# Patient Record
Sex: Female | Born: 1945 | Race: White | Hispanic: No | Smoking: Former smoker
Health system: Southern US, Community
[De-identification: ages and names within clinical notes are randomized; demographics above are authoritative.]

## PROBLEM LIST (undated history)

## (undated) DIAGNOSIS — I050 Rheumatic mitral stenosis: Secondary | ICD-10-CM

## (undated) DIAGNOSIS — I482 Chronic atrial fibrillation, unspecified: Secondary | ICD-10-CM

## (undated) DIAGNOSIS — E119 Type 2 diabetes mellitus without complications: Secondary | ICD-10-CM

## (undated) DIAGNOSIS — Z951 Presence of aortocoronary bypass graft: Secondary | ICD-10-CM

## (undated) HISTORY — DX: Type 2 diabetes mellitus without complications: E11.9

## (undated) HISTORY — DX: Rheumatic mitral stenosis: I05.0

## (undated) HISTORY — DX: Presence of aortocoronary bypass graft: Z95.1

## (undated) HISTORY — DX: Chronic atrial fibrillation, unspecified: I48.20

---

## 2005-08-22 ENCOUNTER — Inpatient Hospital Stay (HOSPITAL_COMMUNITY): Admission: RE | Admit: 2005-08-22 | Discharge: 2005-08-31 | Payer: Self-pay | Admitting: Cardiothoracic Surgery

## 2005-08-24 ENCOUNTER — Encounter (INDEPENDENT_AMBULATORY_CARE_PROVIDER_SITE_OTHER): Payer: Self-pay | Admitting: *Deleted

## 2005-09-15 ENCOUNTER — Encounter: Admission: RE | Admit: 2005-09-15 | Discharge: 2005-09-15 | Payer: Self-pay | Admitting: Cardiothoracic Surgery

## 2005-10-25 ENCOUNTER — Ambulatory Visit: Payer: Self-pay | Admitting: Cardiology

## 2005-10-27 ENCOUNTER — Ambulatory Visit: Payer: Self-pay | Admitting: Cardiology

## 2005-10-28 ENCOUNTER — Ambulatory Visit: Payer: Self-pay | Admitting: Cardiology

## 2005-11-04 ENCOUNTER — Ambulatory Visit: Payer: Self-pay | Admitting: Cardiology

## 2005-11-11 ENCOUNTER — Ambulatory Visit: Payer: Self-pay | Admitting: Cardiology

## 2005-11-25 ENCOUNTER — Ambulatory Visit: Payer: Self-pay | Admitting: Cardiology

## 2005-12-02 ENCOUNTER — Ambulatory Visit: Payer: Self-pay | Admitting: Cardiology

## 2005-12-16 ENCOUNTER — Ambulatory Visit: Payer: Self-pay | Admitting: Cardiology

## 2006-06-26 ENCOUNTER — Ambulatory Visit: Payer: Self-pay | Admitting: Cardiology

## 2006-07-10 ENCOUNTER — Ambulatory Visit: Payer: Self-pay | Admitting: Cardiology

## 2006-07-19 ENCOUNTER — Ambulatory Visit: Payer: Self-pay | Admitting: Cardiology

## 2006-07-26 ENCOUNTER — Ambulatory Visit: Payer: Self-pay | Admitting: Cardiology

## 2006-08-09 ENCOUNTER — Ambulatory Visit: Payer: Self-pay | Admitting: Cardiology

## 2007-02-21 ENCOUNTER — Ambulatory Visit: Payer: Self-pay | Admitting: Cardiology

## 2007-02-26 ENCOUNTER — Ambulatory Visit: Payer: Self-pay | Admitting: Cardiology

## 2007-03-07 ENCOUNTER — Ambulatory Visit: Payer: Self-pay | Admitting: Cardiology

## 2007-03-08 ENCOUNTER — Ambulatory Visit: Payer: Self-pay | Admitting: Internal Medicine

## 2007-03-14 ENCOUNTER — Ambulatory Visit: Payer: Self-pay | Admitting: Cardiology

## 2007-03-30 ENCOUNTER — Ambulatory Visit: Payer: Self-pay | Admitting: Cardiology

## 2007-04-03 ENCOUNTER — Ambulatory Visit: Payer: Self-pay | Admitting: Internal Medicine

## 2007-04-06 ENCOUNTER — Ambulatory Visit: Payer: Self-pay | Admitting: Cardiology

## 2007-04-13 ENCOUNTER — Ambulatory Visit: Payer: Self-pay | Admitting: Cardiology

## 2007-04-18 ENCOUNTER — Ambulatory Visit: Payer: Self-pay | Admitting: Cardiology

## 2007-05-10 ENCOUNTER — Ambulatory Visit: Payer: Self-pay | Admitting: Cardiology

## 2007-06-05 ENCOUNTER — Ambulatory Visit: Payer: Self-pay | Admitting: Cardiology

## 2007-06-12 ENCOUNTER — Ambulatory Visit: Payer: Self-pay | Admitting: Cardiology

## 2007-11-30 ENCOUNTER — Ambulatory Visit: Payer: Self-pay | Admitting: Cardiology

## 2007-12-06 ENCOUNTER — Ambulatory Visit: Payer: Self-pay | Admitting: Cardiology

## 2007-12-11 ENCOUNTER — Ambulatory Visit: Payer: Self-pay | Admitting: Cardiology

## 2007-12-20 ENCOUNTER — Ambulatory Visit: Payer: Self-pay | Admitting: Cardiology

## 2008-01-07 ENCOUNTER — Ambulatory Visit: Payer: Self-pay | Admitting: Cardiology

## 2008-01-19 ENCOUNTER — Inpatient Hospital Stay (HOSPITAL_COMMUNITY): Admission: EM | Admit: 2008-01-19 | Discharge: 2008-01-24 | Payer: Self-pay | Admitting: Interventional Cardiology

## 2008-01-19 ENCOUNTER — Ambulatory Visit: Payer: Self-pay | Admitting: Cardiology

## 2008-01-21 ENCOUNTER — Encounter: Payer: Self-pay | Admitting: Internal Medicine

## 2008-02-01 ENCOUNTER — Ambulatory Visit: Payer: Self-pay | Admitting: Cardiology

## 2008-02-06 ENCOUNTER — Ambulatory Visit: Payer: Self-pay | Admitting: Cardiology

## 2008-02-08 ENCOUNTER — Ambulatory Visit: Payer: Self-pay | Admitting: Cardiology

## 2008-02-19 ENCOUNTER — Ambulatory Visit: Payer: Self-pay | Admitting: Cardiology

## 2008-02-29 ENCOUNTER — Ambulatory Visit: Payer: Self-pay | Admitting: Cardiology

## 2008-03-18 ENCOUNTER — Ambulatory Visit: Payer: Self-pay | Admitting: Cardiology

## 2008-04-04 ENCOUNTER — Ambulatory Visit: Payer: Self-pay | Admitting: Cardiology

## 2008-04-22 ENCOUNTER — Ambulatory Visit: Payer: Self-pay | Admitting: Cardiology

## 2008-05-13 ENCOUNTER — Ambulatory Visit: Payer: Self-pay | Admitting: Cardiology

## 2008-06-10 ENCOUNTER — Ambulatory Visit: Payer: Self-pay | Admitting: Cardiology

## 2008-06-27 ENCOUNTER — Ambulatory Visit: Payer: Self-pay | Admitting: Cardiology

## 2008-07-23 ENCOUNTER — Ambulatory Visit: Payer: Self-pay | Admitting: Cardiology

## 2008-10-08 ENCOUNTER — Ambulatory Visit: Payer: Self-pay | Admitting: Cardiology

## 2008-10-24 ENCOUNTER — Ambulatory Visit: Payer: Self-pay | Admitting: Cardiology

## 2008-12-08 ENCOUNTER — Encounter: Payer: Self-pay | Admitting: *Deleted

## 2009-01-19 ENCOUNTER — Encounter: Payer: Self-pay | Admitting: Cardiology

## 2009-04-27 IMAGING — CR DG CHEST 2V
2 series · 2 of 2 positions shown · non-contrast
Comparison: 09/15/2005 and earlier.

CLINICAL DATA: 62-year-old female with chest pain.

CHEST - 2 VIEW

[view not recorded (1 of 2)]
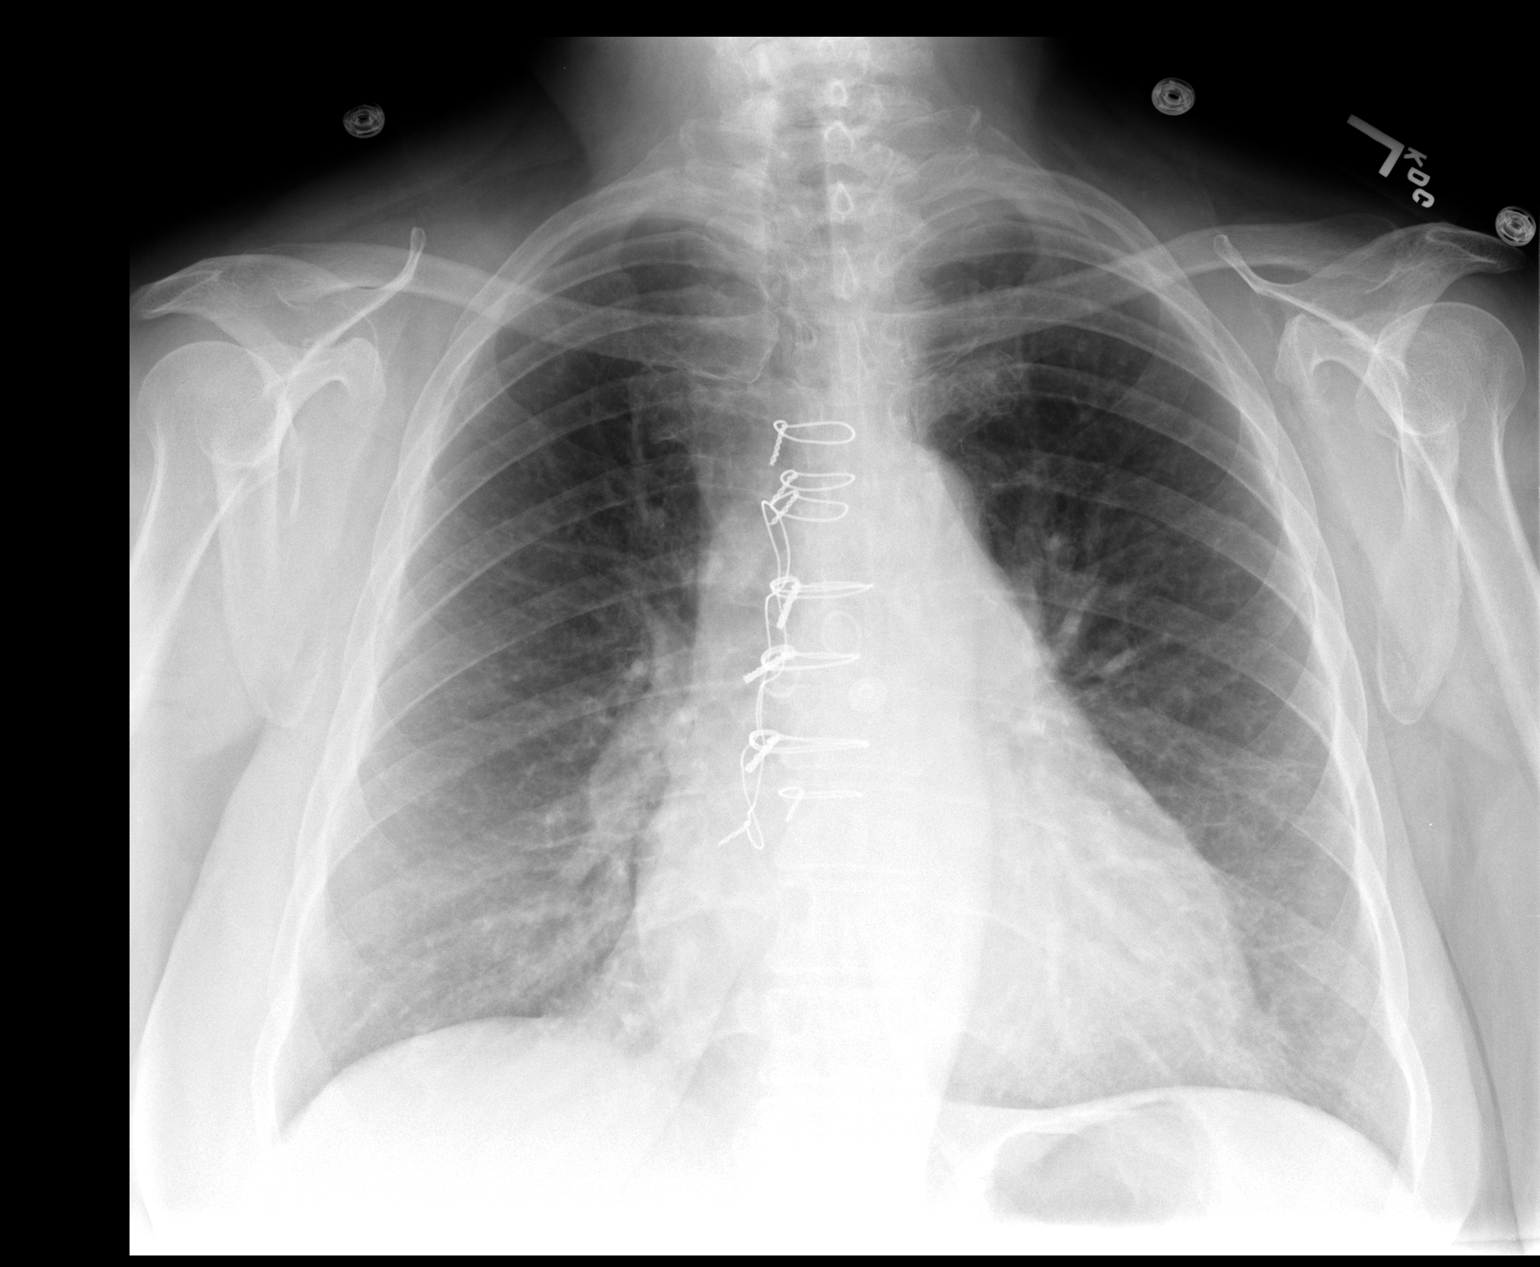

[view not recorded (2 of 2)]
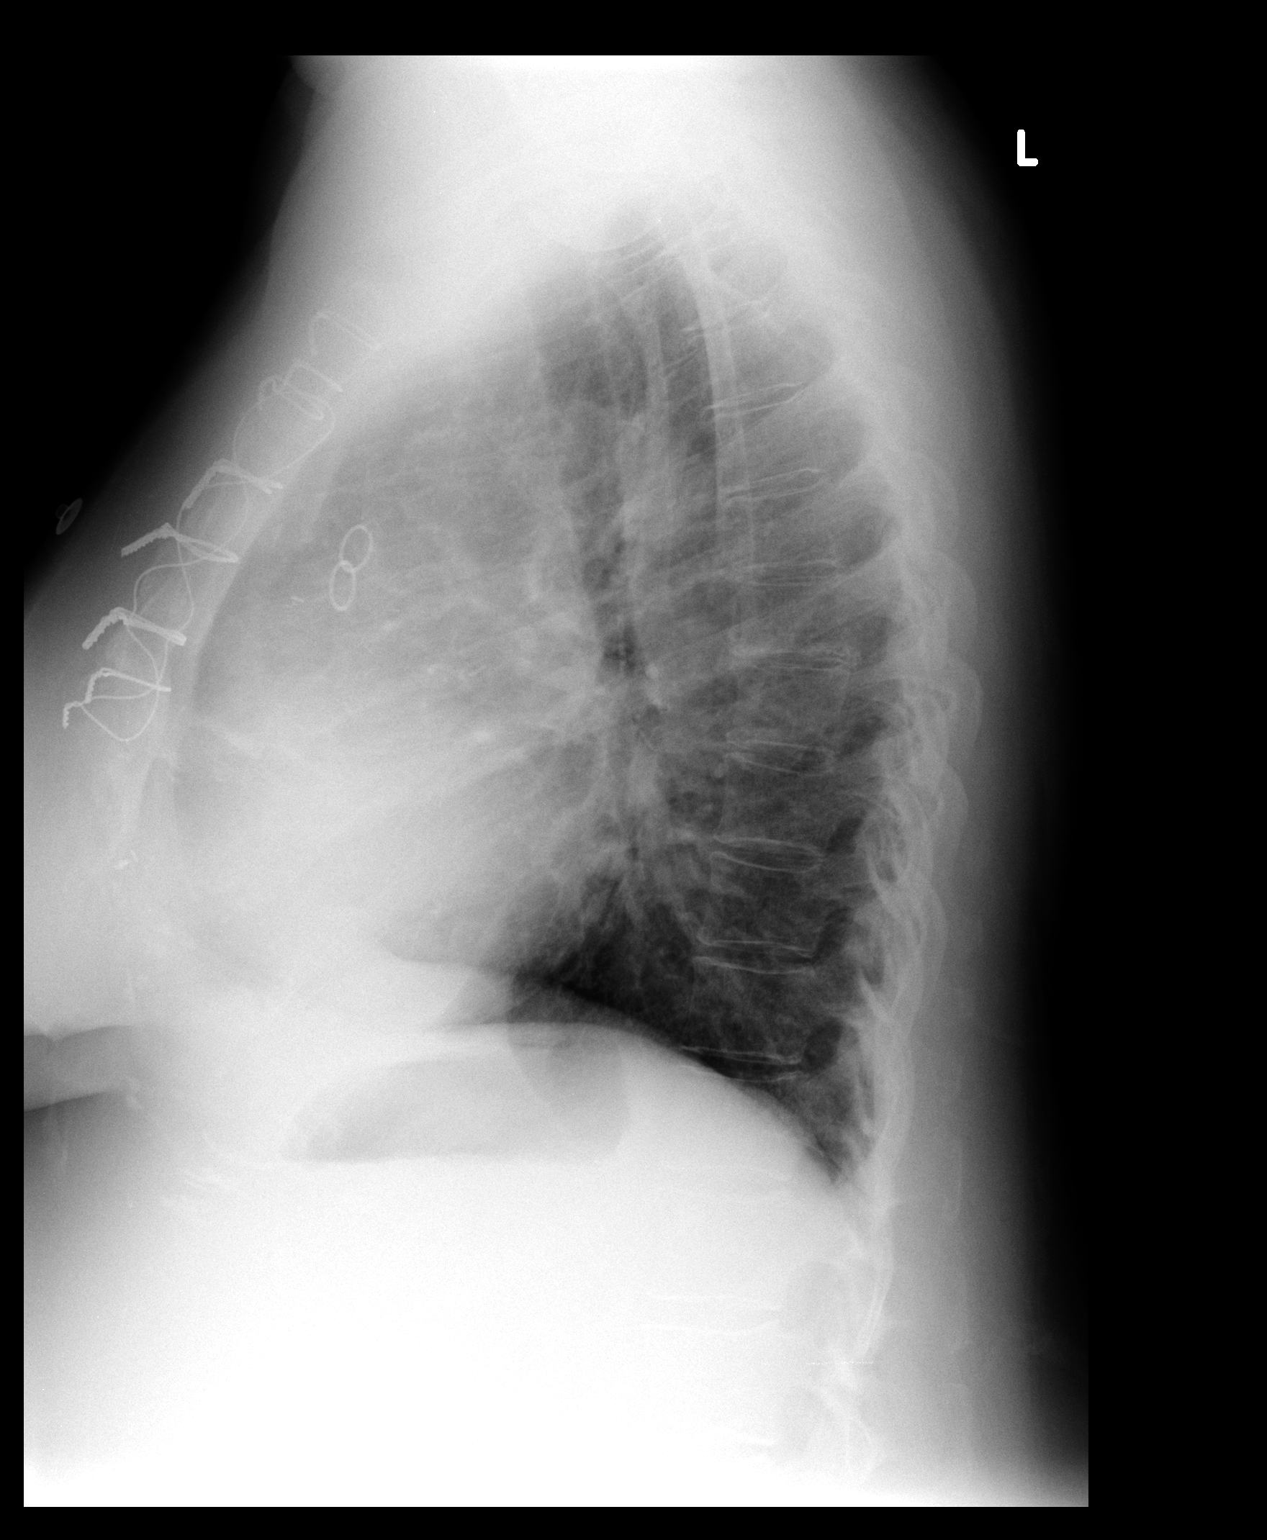

[2 of 2 positions shown; findings below may reference images not displayed]

FINDINGS: Stable cardiac size and mediastinal contours.
Cardiomegaly.  Stable postoperative appearance of the sternum and
mediastinum.  Stable lung volumes.  No pneumothorax, pulmonary
edema, pleural effusion or acute airspace opacity. Stable
visualized osseous structures.
IMPRESSION: No acute cardiopulmonary abnormality.  Stable cardiomegaly.

## 2010-09-07 NOTE — Assessment & Plan Note (Signed)
[facility]                          [facility]   [name], [name] [name]                        MRN:          [id number]  DATE:10/08/2008                            DOB:          Jun 01, 1945    HISTORY OF PRESENT ILLNESS:  The patient is a 65 year old female with a  history of mitral valve replacement with bioprosthesis.  She also had  postoperative atrial fibrillation, status post bilateral Maze procedure  in May 2007, in addition to coronary artery bypass grafting.  The  patient has had also atrial flutter with 3:1 conduction and she was  declined for flutter ablation.  The patient was also never cardioverted.  She did present with an episode of atrial fibrillation and reverted  normal sinus rhythm with Tikosyn.  However, she has stopped Tikosyn  since November 2009.  She states the drug was giving her chest pain.  She states she feels better now off medication.  Today in the office,  she is in sinus bradycardia.   The patient is plan to move to [place].  She will pick up her records  next week.  She is also due for a stress test since her last operative  intervention in 2007.   MEDICATIONS:  1. Pravastatin 40 mg p.o. nightly.  2. Lisinopril 5 mg p.o. daily.  3. Zoloft  50 mg one and half tablet p.o. q.a.m.  4. Slo-Niacin 500 mg p.o. nightly.  5. Warfarin as directed.  6. Levothyroxine 175 mcg p.o. daily.  7. Lanoxin 250 mcg daily, half tablet on Monday, Wednesday, and      Friday.  8. Metoprolol tartrate 50 mg p.o. b.i.d.  9. Aspirin 81 mg p.o. daily.  10.Fenofibrate 160 mg p.o. nightly.  11.Novolin 30 units q.a.m. and 20 units nightly.  12.Levemir Flexpen 6 units q.a.c.  13.Tylenol 1 in the morning and 2 in the evening.  14.Fish oil 1200 mg p.o. daily.   PHYSICAL EXAMINATION:  VITAL SIGNS:  Blood pressure 153/63, heart rate  44, and weight 195 pounds.  GENERAL:  Well-nourished white female in no apparent distress.  HEENT:   Pupils, eyes clear; conjunctivae clear.  NECK:  Supple.  Normal carotid upstroke.  No carotid bruits.  LUNGS:  Clear breath sounds bilaterally.  HEART:  Regular rate and rhythm with normal S1 and S2.  No murmurs,  rubs, or gallops.  ABDOMEN:  Soft and nontender.  No rebound or guarding.  Good bowel  sounds.  EXTREMITIES:  No cyanosis, clubbing, or edema.  NEUROLOGIC:  The patient is alert, oriented, and grossly non focal.   PROBLEM LIST:  1. Sinus bradycardia, stable.  2. Tikosyn discontinued by the patient.  3. History of atrial flutter with 3:1 AV conduction.  4. History of amiodarone use.  5. Coronary artery disease with bioprosthetic mitral valve placement      in 2007 (four-vessel bypass grafting).  6. History of rheumatic heart disease.  7. Treated hypothyroidism.  8. History of pulmonary embolism.  9. Type 2 diabetes mellitus.  10.Hypertension, uncontrolled.   PLAN:  1. The patient's lisinopril will be  increased from 5-10 mg p.o. daily      for better blood pressure control.  2. The patient is due for stress testing as she is 3 years out from      her bypass surgery.  However, she is plan to go to [place] and      is planning to do this later this year, once she has settled in her      new place.  3. The patient will come next week to pick up her records for her      future doctors.  4. I talked with the patient regarding reinitiation of Tikosyn, but      that she otherwise declined this.  I told her that I am in      agreement with this for now as she is in normal sinus rhythm and as      long as she continues to take her warfarin.     Learta Codding, MD,FACC  Electronically Signed    GED/MedQ  DD: 10/08/2008  DT: 10/09/2008  Job #: [id number]   cc:   Sula Rumple, MD

## 2010-09-07 NOTE — Assessment & Plan Note (Signed)
[facility]                          [facility] NOTE   [name], [name]                        MRN:          [id number]  DATE:02/06/2008                            DOB:          Sep 24, 1945    HISTORY OF PRESENT ILLNESS:  The patient is 65 year old female with  history of  mitral valve replacement bioprosthetic valve.  She also has  postoperative atrial fibrillation and is status post bilateral maze  procedure in May 2007.  However, the patient for a long time had  persistent atrial flutter with 3:1 conduction.  We had referred her to  Dr. __________, who did not feel she was a good candidate for flutter  ablation.  There was some talk even given to cardioversion with  __________ under dofetilide.  However, the patient had not been seen in  the office for several months.  She was recently admitted to [facility] because of recurrent palpitation and found to be in atrial  fibrillation.  She was given Tikosyn and converted to normal sinus  rhythm.  She is now in sinus bradycardia.   MEDICATIONS:  1. Pravastatin 40 mg p.o. q.h.s.  2. Lisinopril 5 mg p.o. daily.  3. Levothyroxine 150 mcg p.o. daily.  4. Folic acid.  5. Aspirin 81 mg daily.  6. Tylenol.  7. Digoxin 0.125 mg p.o. daily.  8. Metoprolol 50 mg p.o. b.i.d.  9. Warfarin 2.5 mg daily.  10.Tikosyn 500 mcg 1 tablet p.o. b.i.d.   PHYSICAL EXAMINATION:  VITAL SIGNS:  Blood pressure is 144/72, heart  rate is 47, and weight is 198 pounds.  NECK:  Normal.  No carotid bruits.  LUNGS:  Clear breath sounds bilaterally.  HEART:  Regular rate and rhythm.  Normal S1 and S2.  No murmurs, rubs,  or gallop.  ABDOMEN:  Soft and nontender.  EXTREMITIES:  No cyanosis, clubbing, or edema.   PROBLEM LIST:  1. Status post atrial flutter with 3:1 AV conduction and normal sinus      rhythm.  2. Sinus bradycardia with a normal QTc at 436.  3. History of amiodarone treatment and  cardioversion, now on Tikosyn      and on Coumadin.  4. Coronary artery disease, with bioprosthetic mitral valve      replacement in 2007 as well as four-vessel bypass graft.  5. Rheumatic heart disease.  6. Treated hypothyroidism.  7. History of pulmonary embolism.  8. Type 2 diabetes mellitus.   PLAN:  1. The patient is doing well on Tikosyn although her heart rate is      somewhat slow and we cut back on the metoprolol to b.i.d.  2. The QTc was reviewed.  It is within normal limits.  3. The patient will come back in 6 months for further followup.     Learta Codding, MD,FACC  Electronically Signed    GED/MedQ  DD: 02/06/2008  DT: 02/07/2008  Job #: [id number]

## 2010-09-07 NOTE — Assessment & Plan Note (Signed)
[facility]                         E[facility]ROP[facility]OLOG[facility] OFFICE NOTE   [name], [name]                        MRN:          [id number]  DATE:03/08/2007                            DOB:          [birth year]    [name] is referred today by Dr. Lewayne Bunting for evaluation of atrial  flutter in the setting of prior mitral valve replacement and bypass  surgery and surgical Maze procedure, all carried out in May of 2007. The  patient was noted to have atrial fibrillation after her surgery and was  treated initially with amiodarone and followed by cardioversion. She  maintained sinus rhythm very nicely initially. She had discontinuation  of her amiodarone back in March. She noted palpitations in October of  this year and was found to be in atrial flutter. By her report, she has  had both typical as well as atypical flutter. These are associated with  palpitations but no chest pain or shortness of breath, PND or orthopnea.  She was begun on Lopressor 25 mg twice daily, and she returns today for  followup. The patient states that she has only palpitations and has had  no syncope or lightheadedness. No other symptoms related to her flutter.   PAST MEDICAL HISTORY:  As noted in the HPI. She also has a history of  chronic Coumadin therapy in the past. She has a history of medical  noncompliance. She has a history of rheumatic heart disease. She has a  history of hypertension. She has a remote history of pulmonary embolism.   Her family history was noncontributory.   SOCIAL HISTORY:  The patient denies tobacco or ethanol abuse.   Her review of systems is as noted in the HPI. With significant exertion,  she does have some dyspnea but otherwise has been stable in this way.  She has had no syncope. Otherwise, all systems were reviewed and found  to be negative except for occasional problems with gastroesophageal  reflux disease.   On physical examination, she is  a pleasant, well-appearing, 65 year old  woman who is no acute distress. The blood pressure was 156/95. The pulse  was 117 and irregular. The respirations were 18. The weight was 194  pounds.  HEENT EXAM:  Was normocephalic, atraumatic. Pupils were equal and round.  The oropharynx was moist. The sclerae were anicteric.  The neck revealed no jugular venous distention. No thyromegaly. Trachea  was midline. The carotids were 2+ and symmetric.  LUNGS:  Were clear bilaterally to auscultation. No wheezes, rales or  rhonchi were present. There was no increased work of breathing.  The cardiovascular exam revealed an irregular rhythm with normal S1 and  S2. I do not appreciate any murmurs.  ABDOMINAL EXAM:  Was soft, nontender, nondistended. There was no  organomegaly. The bowel sounds were present. No rebound or guarding.  EXTREMITIES:  Demonstrated no clubbing, cyanosis, or edema. The pulses  were 2+ and symmetric.  NEUROLOGICAL:  Alert and oriented x3 with cranial nerves intact.  Strength was 5/5 and symmetric.   The EKG demonstrates atypical atrial flutter with 2:1 AV conduction.  This is characterized by positive flutter waves in the lead V1 and what  appeared to be positive flutter waves in the inferior leads.   IMPRESSION:  1. Recurrent atrial flutter following Maze surgery and mitral valve      replacement.  2. Hypertension.  3. History of coronary disease status post bypass surgery.   DISCUSSION:  Ms. Ramp' atrial flutter is present but not particularly  symptomatic. Her ventricular rate is increased still. I discussed  treatment options with the patient and her son who is with her today and  works as a Radiation protection practitioner. The first option will be up titration of her beta  blockers for attempted rate control of her flutter. Second option will  be antiarrhythmic drug therapy, restarting her amiodarone. Third option  will be to proceed with catheterization ablation, which would require an   extensive left atrial ablation procedure, cauterizing gaps in her Maze  line placed a year ago. The risks, benefits, goals and expectations of  all of the above options have been discussed with the patient and her  son. For now, we would like to continue medical therapy to try to  control her ventricular rate. I will see her back in several weeks.  Today, I have increased her Lopressor from 25 twice daily to 50 twice  daily.     Doylene Canning. Ladona Ridgel, MD  Electronically Signed    GWT/MedQ  DD: 03/08/2007  DT: 03/09/2007  Job #: [id number]   cc:   Learta Codding, MD,FACC

## 2010-09-07 NOTE — Assessment & Plan Note (Signed)
[facility]                          [facility] NOTE   Alexandria, Kelley [name]                        MRN:          [id number]  DATE:06/05/2007                            DOB:          [birth year]    HISTORY OF PRESENT ILLNESS:  The patient is a 65 year old female with a  history of mitral valve replacement and bioprosthetic valve.  She also  has postoperative atrial fibrillation status post bilateral MAZE  procedure in May 2007.  However, the patient has had persistent atrial  flutter with 3:1 conduction.  The patient has seen Dr. Ladona Ridgel previously  and is felt not to be a good candidate for a flutter ablation.  There  was some talk given as to cardiovert the patient and place her on  Dofetilide; however, she has not been seen in the office for several  weeks and a Coumadin has not been checked.  The patient denies any chest  pain and no shortness of breath, and she actually stated that she is  doing quite well.  She does not feel any palpitations.   MEDICATIONS:  1. Lipitor 20 mg p.o. daily.  2. Coumadin 2 mg p.o. daily.  3. Folic acid daily.  4. Lisinopril 10 mg, a half a tablet daily.  5. Tylenol.  6. Metoprolol 50 mg p.o. q.12.  7. Digoxin 0.125 mg p.o. daily.  8. Metformin 500 mg p.o. b.i.d.  9. Levoxyl 100 mcg p.o. daily.   PHYSICAL EXAMINATION:  VITAL SIGNS:  Blood pressure is 126/79, heart  rate is 110, weighs 192 pounds.  NECK EXAM:  Normal carotid upstroke and no carotid bruits.  LUNGS:  Clear breath sounds bilaterally.  HEART:  A regular rate and rhythm but tachycardic, no murmur, rubs, or  gallops.  ABDOMEN:  Soft.  EXTREMITY EXAM:  No cyanosis, clubbing, or edema.  NEURO:  The patient is alert, oriented, and grossly nonfocal.   PROBLEM LIST:  1. Atrial flutter with 3:1 AV conduction.  2. History of a MAZE procedure.  3. History of Amiodarone treatment and cardioversion with restoration      to normal sinus rhythm.  4.  A history of chronic Coumadin.  5. Coronary artery disease with concomitant bioprosthetic mitral valve      replacement in 2007, and a four-vessel coronary bypass graft.  6. Rheumatic heart disease.  7. Treated hypothyroidism.  8. Pulmonary embolism.  9. Type 2 diabetes mellitus.   PLAN:  1. I have asked the patient to identify herself with a primary care      physician particularly in light of her diabetes and multiple      medical problems.  2. The patient stated that she had some swelling at the sternotomy      site, and we have ordered a chest x-ray, PA and lateral, to make      sure she does not have any type of wire fracture.  The sternum on      physical examination, however, was stable with no click.  3. The patient's heart rate is poorly controlled and we will increase  Metoprolol to 50 mg p.o. t.i.d.; however, I anticipate that the      patient after proper anticoagulation will need to be considered for      cardioversion and Dofetilide therapy.  4. The patient will come back for PT-INR monitoring and consider      initial therapy in the next couple of weeks.     Learta Codding, MD,FACC  Electronically Signed    GED/MedQ  DD: 06/05/2007  DT: 06/06/2007  Job #: [id number]

## 2010-09-07 NOTE — H&P (Signed)
NAME:  [name], [name]                     ACCOUNT NO.:  [id number]   MEDICAL RECORD NO.:  [id number]          PATIENT TYPE:  INP   LOCATION:  3707                         FACILITY:  [facility]   PHYSICIAN:  Rollene Rotunda, MD, FACCDATE OF BIRTH:  1946-03-25   DATE OF ADMISSION:  01/19/2008  DATE OF DISCHARGE:                              HISTORY & PHYSICAL   PRIMARY CARE PHYSICIAN:  None.   CARDIOLOGIST:  Learta Codding, MD,FACC   REASON FOR PRESENTATION:  Evaluate patient with atrial flutter and chest  pain.   HISTORY OF PRESENT ILLNESS:  The patient is a 65 year old white female a  long history of coronary disease and atrial arrhythmias.  She has had  atrial fibrillation and maze procedure at the time of bypass and mitral  valve replacement.  She has seen Dr. Andee Lineman for this problem and also  Dr. Ladona Ridgel.  She has been in flutter for some time.  The rates have been  slightly difficult to control.  Dr. Ladona Ridgel thought it would be difficult  to ablate because of her previous maze procedure.  The plan was to get  her anticoagulated and plan another cardioversion.  She had one before.  She had been on amiodarone before.  This time.  The thought was to use  Tikosyn.  However, she has not had therapeutic INRs yet.   She presented to [facility] emergency room last evening with her heart  fluttering slightly more than usual.  She also, for the first time, had  chest discomfort.  It was 6/10.  It was sharp and dull.  It was a spot  over her left breast.  It was waxing and waning in intensity.  There was  no radiation to her arms or to her neck.  She had not had this before.  She presented to the [facility] ER where she was found to have flutter  with predominantly 2:1, but also 3:1 conduction.  There were no  diagnostic EKG changes.  First cardiac enzymes were negative.  She was  treated with Cardizem nitroglycerin paste.  She was treated with  Lovenox.  She is pain free.  Atrial flutter as rate is  3:1.  Of note, in  the emergency room her blood sugar was 500.  She was treated with  insulin.  She also had a sodium of 125 and mention of a lipemic blood.   The patient does get around, does some housekeeping.  She says that  recently she has had no chest pressure, neck or arm discomfort.  She has  had no presyncope or syncope.  She has no new dyspnea.  Denies any PND  or orthopnea.  She does feel her heart fluttering occasionally.   PAST HISTORY:  1. 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  She is      status post mitral valve replacement with a [employer] tissue valve      model number [id number].  2. Coronary artery disease (status post CABG with a LIMA to the LAD,      SVG to diagonal, SVG  sequential to an acute marginal and PDA.  All      surgeries in 2007 by Dr. Tyrone Sage).  3. Atrial fibrillation status post maze procedure at the time of      surgeries.  4. Subsequent atrial flutter.  5. Poorly controlled diabetes.  6. History of pulmonary embolism.  7. Hypothyroidism.   PAST SURGICAL HISTORY:  1. Mitral valve replacement.  2. CABG.  3. Maze as described.  4. Tubal ligation.   ALLERGIES:  PENICILLIN.   MEDICATIONS:  1. Levoxyl 175 mcg.  2. Lisinopril 5 mg.  3. Lipitor 20 mg.  4. Coumadin.  5. Folic acid.  6. Metoprolol 50 mg t.i.d.  7. Digoxin 0.125 mg daily.   SOCIAL HISTORY:  The patient is a widow.  She quit smoking in 2007 after  45 pack-year history.   FAMILY HISTORY:  Noncontributory for early coronary artery disease.   REVIEW OF SYSTEMS:  Positive for mild diarrhea, but otherwise negative  for all other systems.   PHYSICAL EXAMINATION:  GENERAL:  The patient is in no distress.  VITAL SIGNS:  Blood pressure 103/64, heart rate 113 and irregular,  respiratory rate 20, afebrile.  HEENT:  Eyes are unremarkable.  Pupils equal, round and reactive to  light.  Fundi not visualized, oral mucosa normal.  NECK:  No jugular distention at 45  degrees.  Carotid upstroke brisk and  symmetrical.  No bruits, thyromegaly.  LYMPHATICS:  No cervical, axillary, inguinal adenopathy.  LUNGS:  Clear to auscultation bilaterally.  BACK:  No costovertebral mass.  CHEST:  Unremarkable.  HEART:  PMI not displaced or sustained, S1 and S2 within normal limits.  No S3, S4, no clicks, rubs, murmurs, distant heart sounds.  ABDOMEN:  Morbidly obese, positive bowel sounds, normal in frequency and  pitch.  No bruits, rebound, guarding or midline pulsatile mass.  No  hepatosplenomegaly.  SKIN:  No rashes, no nodules.  EXTREMITIES:  Upper pulse 2+, femorals 2+, dorsalis pedis 1+ on the  left, absent dorsalis pedis on the right, absent posterior tibialis  bilaterally, no cyanosis, clubbing.  NEUROLOGICAL:  Oriented to person, place and time.  Cranial nerves II-  XII grossly intact, motor grossly intact.   STUDIES:  EKG atrial flutter, axis within normal limits.   LABORATORY DATA:  Labs sodium 125, potassium 3.8, BUN 14, creatinine  0.29, digoxin less than 0.2, cardiac markers times one negative, INR  1.6, pH 7.412, pCO2 37.6, WBC 8.7, hemoglobin 14.8, platelets 220.   Chest x-ray (this was done at [facility], I do not have a report).   ASSESSMENT/PLAN:  1. The patient will continue Lovenox which was started an      [place].  Will use rate control, but I am going to switch her      to metoprolol.  I am going to restart digoxin.  Her low digoxin      level makes me wonder whether she is compliant with any of her      medications.  Will wean the diltiazem.  She will be started on      Coumadin per pharmacy.  I would suggest a TEE, and if no clot, the      initiation of Tikosyn and plan cardioversion which was the plan per      Dr. Andee Lineman and Dr. Ladona Ridgel.  Cost for her medications will be an      issue.  2. Chest pain.  The patient will be ruled out myocardial infarction.  If her enzymes are negative, we could consider a Cardiolite  which      could also be done as an outpatient.  I am not anticipating      invasive workup, so we will start the Coumadin.  3. Diabetes.  She is not at all controlled.  I will put her on her      metformin.  She needs sliding scale insulin.  She may need insulin      and a primary care consult before discharge.  She needs a primary      MD.  4. Hyponatremia.  This may be related to the lipemic blood that was      seen.  We will repeat this.  We will check a blood draw for a lipid      profile.  5. Hypothyroidism.  Check a TSH.  I will continue her current      Synthroid.      Rollene Rotunda, MD, [facility]  Electronically Signed     JH/MEDQ  D:  01/19/2008  T:  01/19/2008  Job:  [id number]

## 2010-09-07 NOTE — Discharge Summary (Signed)
NAMEMarland Kitchen  [name], [name] NO.:  [id number]   MEDICAL RECORD NO.:  [id number]          PATIENT TYPE:  INP   LOCATION:  3707                         FACILITY:  [facility]   PHYSICIAN:  Doylene Canning. Ladona Ridgel, MD    DATE OF BIRTH:  1946/04/08   DATE OF ADMISSION:  01/19/2008  DATE OF DISCHARGE:  01/24/2008                               DISCHARGE SUMMARY   She has allergy to PENICILLIN.   This patient does not have a primary physician.   FINAL DIAGNOSES:  1. Admitted with palpitations and chest pressure.      a.     Atrial flutter with rapid ventricular rate.      b.     Tikosyn started this admission was spontaneous conversion       after dose #2.      c.     The patient had no significant posttermination pause with       dizziness.      d.     Tikosyn therapy at discharge is 250 mcg b.i.d.  2. Admitted hemoglobin A1c is 10.8/no diabetic control previous to      this admission.      a.     Started insulin therapy this admission with Lantus, but will       go home with Humulin N.      b.     Despite 50 units of long-term coverage, the patient still       requires preprandial coverage.  3. TSH 38.646, the patient has hypothyroidism, not taking her      medications at home, discharging on levothyroxine 150 mcg daily.  4. The patient is on the indigent program with Tikosyn and has filled      out all requisite paperwork.   SECONDARY DIAGNOSES:  1. History of rheumatic fever.      a.     History of coronary artery bypass graft surgery, mitral       valve replacement, MAZE procedure in 2007.  2. Known hypothyroidism.  3. Diabetes, poor control.  4. Hypertension.  5. History of atrial flutter and atrial fibrillation, previously on      Coumadin, but subtherapeutic this admission.  6. History of pulmonary embolism.  7. Status post tubal ligation.   The patient had a consultation with diabetes management with effective  institution of insulin therapy.  The patient had effective  treatment  with Tikosyn and did not require cardioversion, but spontaneously  converted after dose #2 and is maintaining sinus rhythm at discharge.  The patient will be restarted on her levothyroxine for control of  hypothyroidism.   BRIEF HISTORY:  [name] is a 65 year old female.  She has a history  of atrial arrhythmias both atrial fibrillation and atrial flutter.  She  had a MAZE procedure in 2007, at the time of coronary artery bypass  graft surgery and mitral valve replacement.   The patient has had rate control, which is difficult.  It is thought  that ablation of atrial flutter will be difficult.  Because of her  previous MAZE  procedure, she had had amiodarone in the past feeling that  a second line would be using Tikosyn.   The patient presented to [facility] Emergency Room on January 18, 2008, she had chest discomfort and palpitation.  There was no radiation  to arm or neck.  She was found to have atrial flutter 2:1 conduction  with possibly 3:1 conduction.  Cardiac enzymes were negative.  She  received Cardizem, nitroglycerin, and Lovenox.  Her blood sugar at the  time of admission to the emergency room was 500.  She has been started  on insulin.  The patient has no presyncope or syncope in her past.  The  patient will be started on rate control with metoprolol and digoxin.  She will be started on Tikosyn.  She will have her cardiac enzymes fully  cycled.  She will have a TSH.  Diabetes therapy, we will start with  insulin.   HOSPITAL COURSE:  The patient presents to [facility] on  January 19, 2008, her INR was subtherapeutic at 1.3.  Her TSH is  grossly elevated at 38.646.  She had elevated serum glucose and she is  in atrial flutter.  Remediations for all of these problems have been  successfully instituted.  At the time of discharge, her INR is 2.4.  She  is on levothyroxine 150 mcg daily.  She is on an insulin regimen with  both Humulin N and  Humulin R.  All her medications are at the [place],  $4 per month except for Tikosyn, which is being administered by [employer]  on [employer] plan, and she is successfully converted to sinus rhythm  at the time of discharge.   Medications at discharge are as follows:  1. Coumadin 5 mg tablets daily except for 1 and 1-1/2 tablets, 7.5 mg      Sunday and Thursday.  2. Tikosyn 250 mcg twice daily 12 hours apart.  3. Digoxin 0.125 mg daily.  4. Metoprolol 50 mg twice daily.  5. Levothyroxine 150 mcg daily.  6. Lisinopril 5 mg daily.  7. Pravastatin 40 mg daily at bedtime.  8. Humulin N 30 units in the morning and 20 units in the evening.  9. Humulin R injected per sliding scale, and the patient has been      given our sliding scale, resistant scale to take home with her.      She has a One Touch at home.  She has been given some insulin      syringes.   She follows at [facility], [place].  She will bring the indigent  forms for [facility] to send to [employer] at the address on the  form.  She has [facility] on Friday, February 01, 2008, at 1:45.  She  will see Dr. Andee Kelley on Wednesday, February 06, 2008, at 1:15.   At the time of discharge, laboratory studies are white cells 7.4,  hemoglobin 12.8, hematocrit 36.8, and platelets 201.  Sodium 135,  potassium 4.9, chloride 101, carbonate 28, BUN is 11, creatinine 0.65,  and glucose 227.  PT is 28.2,  INR 2.4, alkaline phosphatase of 69, SGOT 97, and SGPT is 128.  TSH this  admission was 38.646.  Hemoglobin A1c this admission was 10.8.  Of note,  a dosing schedule of Coumadin will be sent to the [facility]madin Clinic to  help them with her ongoing dosing.      Maple Mirza, PA      Doylene Canning. Ladona Ridgel, MD  Electronically Signed    GM/MEDQ  D:  01/24/2008  T:  01/25/2008  Job:  [id number]   cc:   Alexandria Codding, MD,FACC  Doylene Canning. Ladona Ridgel, MD

## 2010-09-07 NOTE — Assessment & Plan Note (Signed)
[facility]                          [facility]ICE NOTE   [name], [name]                        MRN:          [id number]  DATE:02/26/2007                            DOB:          1945/06/15    HISTORY OF PRESENT ILLNESS:  Patient is a 64 year old with a history of  mitral stenosis status post mitral valve replacement with a tissue  prosthesis.  Patient also has history of 4-vessel coronary artery bypass  grafting.  In addition, the patient underwent left and right-sided maze  procedure, all performed in May 2007.  Postoperatively, the patient had  recurrent atrial fibrillation, and was treated with amiodarone followed  by cardioversion in July 2007.  I last saw the patient in the office on  July 10, 2006, at which time she was in normal sinus rhythm.  Amiodarone was discontinued, and the plan was also to discontinue  Coumadin.  These objectives were achieved, but the patient, on February 21, 2007, started complaining again of palpitations.  I asked her to  come to our office, and an EKG was obtained.  She was found to be in  atrial flutter with a type 1 atrial flutter (typical of flutter).  Indeed, also the patient reported palpitations, although she has  reported no chest pain, shortness of breath, orthopnea, or PND.  At that  point, we restarted patient back on Coumadin, and gave her a new  prescription for Lopressor 25 mg p.o. b.i.d.  The patient's heart is now  controlled with a heart rate of 75 beats per minute.  She states that on  occasions a night she still feel rapid heart rates, but they are very  brief in nature.  I asked the patient to come back to our office now to  discuss more permanent treatment of her atrial flutter by means of a  radio frequency flutter ablation.  In the interim, I had also scheduled  the patient for an appointment with Dr. Ladona Ridgel in the [facility], as no openings were available in the  [place] office were  available.  The patient will bee seen there on March 01, 2007 at 3:15.  The patient stated that this was not inconvenient at all for her to go  to our [place] office.   MEDICATIONS:  1. Lipitor 20 mg p.o. daily.  2. Coumadin, now as directed (recently restarted.  See outline above).  3. Folic acid 1 mg p.o. daily.  4. Levoxyl 175 mcg p.o. daily.  5. Metoprolol 25 mg p.o. b.i.d.  6. Aspirin 81 mg p.o. daily.  7. Lisinopril 10 mg 1/2 tablet p.o. daily.  8. Tylenol p.r.n.   PHYSICAL EXAMINATION:  Blood pressure 133/78.  Heart rate 75 beats per  minute.  Weight is 194 pounds.  NECK EXAM:  Normal carotid upstroke, and no carotid bruits.  LUNGS:  Clear breath sounds bilaterally.  HEART:  Regular rate and rhythm.  Normal S1 and S2.  No murmurs, rubs,  or gallops.  ABDOMEN:  Soft and nontender.  No rebound or guarding.  Good bowel  sounds.  EXTREMITY EXAM:  No cyanosis, clubbing, or edema.   INR today is 1.3.  Appropriate adjustments in Coumadin were made.   PROBLEM LIST:  1. History of atypical atrial flutter, now with typical flutter      (recurrence).  2. History of atrial fibrillation and mitral stenosis.      a.     Status post 4-vessel coronary bypass grafting and mitral       valve replacement with a tissue prosthesis.      b.     Status post left and right-sided maze procedure (A and B       were performed in May 2007).      c.     Status post recurrent atrial fibrillation postoperatively       treated amiodarone and cardioversion with maintenance of normal       sinus rhythm until February 21, 2007.  3. Previous Coumadin anticoagulation, and now Coumadin anticoagulation      restarted.  4. Prior history of medical noncompliance (compliance issues have been      resolved).  5. Mild left ventricular dysfunction with ejection fraction 45% (no      recent echo).  6. Rheumatic heart disease.  Discontinued.  7. Hypertension.  8. Pulmonary  embolism 2 years ago.   PLAN:  1. The patient has now recurrence of typical atrial flutter.  She is      symptomatic with this.  She is status post left and right-sided      maze procedure.  She has a prior history of postoperative atrial      fibrillation, but we have not seen recurrence of atrial      fibrillation.  I will refer the patient to Dr. Ladona Ridgel for      consideration of radiofrequency ablation.  2. In the interim, the patient has been started back on Coumadin, as      well as beta blocker therapy.  She appears to be doing well on this      current regimen.  We have adjusted her Coumadin in the office      today.  3. Patient will also be scheduled for repeat echocardiographic test      study.  4. In addition, the patient is on Levoxyl, and I do not have any      recent thyroid function studies.  We will get a TSH.  We will also      check a CBC, BMET, and hemoglobin A1c.  5. Patient will follow up with [place] in 1 month, and in the interim will      see Dr. Ladona Ridgel for consideration of radiofrequency ablation.     Learta Codding, MD,FACC  Electronically Signed    GED/MedQ  DD: 02/26/2007  DT: 02/27/2007  Job #: [id number]

## 2010-09-07 NOTE — Assessment & Plan Note (Signed)
[facility]                          [facility] NOTE   [name], [name]                        MRN:          [id number]  DATE:03/30/2007                            DOB:          [birth year]    PRIMARY CARDIOLOGIST:  Dr. Lewayne Bunting.   PRIMARY ELECTROPHYSIOLOGIST:  Dr. Lewayne Bunting.   REASON FOR VISIT:  Scheduled follow up. Please refer to Dr. Margarita Mail  recent note of November 3rd, for full details.   Since her last clinic visit, the patient underwent evaluation by Dr.  Lewayne Bunting with respect to her recurrent atrial flutter. He indicated  that she was not particularly symptomatic with this, even with a rate of  117. He recommended an initial option of increasing her Lopressor to 50  mg b.i.d. His second option was to resume Amiodarone, which was  discontinued this past March. His third option was catheter ablation,  but he noted that this would be an extensive procedure involving the  left atrium and cauterizing gaps in her MAZE line, which was placed one  year earlier. He, therefore, recommended continued medical therapy and  she is scheduled to return to him in [name], for continued follow  up, on December 9th.   Clinically, the patient tells me today that she is feeling much better,  following the increased dose of Lopressor. This is, however, despite  that the fact that her rate continues to be elevated (112 currently).  She seems to feel that she is having much less frequent episodes of the  heart pounding sensation.   The patient otherwise denies any interim development of angina pectoris  or dyspnea.   We also reviewed the extensive blood work which Dr. Andee Lineman recently  ordered; these findings were notable for a glucose of 349 with a  hemoglobin A1C of 14.8. The patient has not been previously formerly  diagnosed with diabetes mellitus. She also had a TSH of 0.07, and this  is on Levoxyl. She otherwise has normal  electrolytes, renal function,  and a CBC.   Electrocardiogram today reveals atrial flutter at 112 bpm with normal  axis and non-specific ST changes.   MEDICATIONS:  1. Coumadin 2 mg as directed.  2. Lipitor 20 mg daily.  3. Levoxyl 175 mcg daily.  4. Aspirin 81 mg daily.  5. Lisinopril 5 mg daily.  6. Metoprolol 50 mg every 12 hours.   INR today is 1.8.   PHYSICAL EXAMINATION:  VITAL SIGNS:  Blood pressure 132/91, pulse 112  and regular, weight 187.8 pounds (down 7 pounds).  GENERAL:  This is a 65 year old female, mildly obese, sitting upright in  no distress.  HEENT:  Normocephalic atraumatic.  NECK:  Palpable bilateral carotid bruits without bruits; no JVD.  LUNGS:  Clear to auscultation in all fields.  HEART:  Regular rhythm with increased rate. No significant murmurs.  ABDOMEN:  Benign.  EXTREMITIES:  No significant edema.  NEUROLOGIC:  No focal deficit.   IMPRESSION:  1. Persistent atrial flutter.      a.     2:1 AV conduction.  b.     History of postoperative atrial fibrillation, status post       bilateral MAZE procedure, May 2007.      c.     Status post previous treatment with Amiodarone and previous       cardioversion with restoration of NSR.  2. Chronic Coumadin.  3. Coronary artery disease.      a.     Four vessel CABG with concomitant bioprosthetic MVR, May       2007.  4. Rheumatic heart disease.  5. Treated hypothyroidism.      a.     Recent TSH level of 0.07.  6. History of pulmonary embolism.  7. Type-2 diabetes mellitus.      a.     Newly diagnosed.   PLAN:  1. Continue beta blocker at current dosage, given the patient's      reported symptomatic improvement at this dose. The patient was also      due to return to Dr. Lewayne Bunting in [place], on December 9th,      for further recommendations as well.  2. Down titrate Levoxyl to 0.125 mg daily, in light of recent markedly      low TSH level. She will need a follow up TSH in 4-6 weeks.  3.  Initiate diabetic mellitus treatment with Glucophage 500 mg b.i.d.      The patient will also be referred to the diabetes center here at      [facility], for further assistance in management.  4. The patient is strongly advised to establish with a primary care      physician. Her preference is to have a primary MD here in [place].  5. Closely monitor Protimes on a weekly basis. Plan is to consider a      re-attempt at DC cardioversion in the near future. This will      require dofetilide dosing with hospitalization at [facility], for close monitoring. We will discuss this further at      time of her next      scheduled clinic follow up with [place], in approximately four weeks.      The patient is in agreement with this plan.      Gene Serpe, PA-C  Electronically Signed      Alexandria Codding, MD,FACC  Electronically Signed   GS/MedQ  DD: 03/30/2007  DT: 03/31/2007  Job #: [id number]

## 2010-09-07 NOTE — Assessment & Plan Note (Signed)
[facility]                         ELECTROPHYSIOLOGY OFFICE NOTE   [name], [name]                        MRN:          [id number]  DATE:04/03/2007                            DOB:          August 18, 1945    SUBJECTIVE:  Ms. Ey returns today for followup.  She is a very  pleasant middle-aged woman with a history of atrial flutter and  fibrillation and coronary artery disease, status post bypass surgery, a  Mays procedure over one year ago.  She had initial atrial fibrillation  and then developed atrial flutter several months ago, with a rapid  ventricular response.  I saw her back in the office back in November.  At that time she was relatively asymptomatic.  I should also note that  she has undergone mitral valve replacement.  At that time we talked  about different treatment options.  The patient had been on amiodarone,  but this was discontinued back in March 2008.  She has now been on beta  blockers, including metoprolol 50 mg twice daily with rapid asymptomatic  ventricular response.  When I saw her back in November, we increased her  Lopressor from 25 mg twice daily to 50 mg twice daily.  She continues to  feel well but otherwise has palpitations.   PHYSICAL EXAMINATION:  GENERAL:  She is a pleasant, well-appearing  middle-aged woman, in no acute distress.  VITAL SIGNS:  Blood pressure today 120/76, pulse 112 and regular,  respirations 18, weight 187 pounds.  NECK:  No jugular venous distention.  LUNGS:  Clear bilaterally to auscultation.  No wheezes, rales or rhonchi  are present.  CARDIOVASCULAR:  A regular tachycardia with normal S1 and S2.  EXTREMITIES:  No edema.   CURRENT MEDICATIONS:  1. Lisinopril 10 mg, 1/2 tab daily.  2. Coumadin as directed.  3. Lipitor 20 mg daily.  4. Metoprolol 50 mg twice daily.  5. Metformin 500 mg twice daily.  6. Aspirin 81 mg daily.  7. Synthroid 125 mcg daily.   Electrocardiogram demonstrates  atrial flutter (atypical), with a rapid  ventricular response.   IMPRESSION:  1. Persistent atrial flutter with a rapid ventricular response,      despite beta blockers.  2. History of bypass surgery and Mays procedure with recurrent atrial      fibrillation post-procedure.  3. Chronic Coumadin therapy with sub-therapeutic INRs.   DISCUSSION:  Overall Ms. Hallberg is stable.  I think she will probably  require an anti-arrhythmic drug to maintain sinus rhythm.  She is not  particularly symptomatic in her flutter by her report.  I have  recommended that we for now continue her beta blockers and also start  digoxin 0.125 mg daily.   FOLLOWUP:  I will see her back in the office in approximately one month.  At that time, hopefully her INRs will be therapeutic, and we can  schedule her for a DC cardioversion.     Doylene Canning. Ladona Ridgel, MD  Electronically Signed    GWT/MedQ  DD: 04/03/2007  DT: 04/04/2007  Job #: [id number]

## 2010-09-10 NOTE — Discharge Summary (Signed)
NAMEMarland Kitchen  [name], [name] NO.:  [id number]   MEDICAL RECORD NO.:  [id number]          PATIENT TYPE:  INP   LOCATION:  2037                         FACILITY:  [facility]   PHYSICIAN:  Sheliah Plane, MD    DATE OF BIRTH:  [birth year]   DATE OF ADMISSION:  08/22/2005  DATE OF DISCHARGE:  08/31/2005                                 DISCHARGE SUMMARY   ADMITTING DIAGNOSES:  1.  Significant two vessel coronary artery disease.  2.  Critical mitral stenosis with symptoms of progressive heart failure.  3.  Atrial fibrillation.  4.  History of pulmonary embolism.  5.  Pulmonary hypertension.   DISCHARGE/SECONDARY DIAGNOSES:  1.  Significant two vessel coronary artery disease status post coronary      artery bypass graft.  2.  Critical mitral stenosis status post mitral valve repair.  3.  History of pulmonary embolism.  4.  Preoperative atrial fibrillation with recurrent atrial      fibrillation/atrial flutter postoperatively.  5.  History of rheumatic fever.  6.  History of costochondritis.  7.  Hypothyroidism.  8.  Tubal ligation 1976.  9.  Allergy to PENICILLIN.  10. Mild sternal drainage postoperatively (discharged home with one week of      Keflex).  11. History of tobacco use, but vows to quit postoperatively.  12. Retrosternal nodular density per chest x-ray on April 30, but with      follow-up CT scan showing scarring.  13. Diffuse fatty infiltration of the liver with small hiatal hernia per CT      scan on Aug 23, 2005.   PROCEDURES:  Aug 24, 2005:  Coronary artery bypass grafting x4 the left  internal mammary artery to the left anterior descending, reverse saphenous  vein graft to the diagonal coronary artery, sequential reverse saphenous  vein graft to the acute marginal posterior descending coronary arteries,  mitral valve replacement using the [employer] model [id number], serial  [id number], mitral tissue valve prosthesis and left-to-right side Maze  procedure with endoscopic vein harvesting from the right leg.  Surgeon Dr.  Sheliah Kelley.   BRIEF HISTORY:  [name] is a 65 year old Caucasian female with a history of  rheumatic fever as a child; however, until February of this year was unaware  that she had any significant mitral valve disease.  Several years ago she  had echocardiogram, was told everything was okay with her valves.  In late  February she was admitted to [facility] in [place] with dyspnea  of symptoms and right pleuritic pain.  She was told she had a blood clot in  her lung and was also found to have significant mitral stenosis.  She was  started on Coumadin and ultimately was referred for cardiology consultation  and cardiac catheterization.  She has limited exercise tolerance and  increasing orthopnea and paroxysmal nocturnal dyspnea over the past several  months.  On August 03, 2005 she had episode of dizziness and presented to the  emergency room in [place].  Her INR was noted to be 7 and was told to  stop  taking Coumadin.  Echocardiogram was performed on March 28 which showed  normal left ventricular function with mild global left ventricular systolic  dysfunction with ejection fraction estimated at 45%, mild concentric left  ventricular hypertrophy, moderate to severe valvular stenosis with a mean  gradient of 8.6, and valve area estimated at 1, mild mitral regurgitation,  and moderate tricuspid regurgitation, and moderate pulmonary hypertension.  There was no pericardial effusion, but mild bi-atrial enlargement, aortic  valve sclerosis with no evidence of stenosis.  She underwent cardiac  catheterization by [name] which in addition to confirming the diagnosis  of critical mitral stenosis showed significant three vessel coronary artery  disease.  She had no history of myocardial infarction and no previous  catheterizations or angioplasties.  She is a long-time smoker of at least  one pack  of cigarettes per day for 45 years.  She was referred to [name] for consideration of coronary artery bypass grafting surgery and  mitral valve replacement.  After examination of the patient and review of  her diagnostic data [name] felt she would benefit from coronary artery  bypass graft surgery combined with mitral valve replacement.  Of note, the  patient did have preoperative atrial fibrillation and preferred not to be  committed to long-term Coumadin.  Therefore, tissue valve was planned as  well as a Maze procedure in helps to treat her atrial fibrillation.   HOSPITAL COURSE:  [name] was admitted to [facility] on August 22, 2005.  She was started on heparin preoperatively for her atrial  fibrillation.  Her surgery was scheduled for May 2 and she was taken to the  operating room as planned with procedure details discussed above.  Her  postoperative course was rather uneventful other than developing a  recurrence of her atrial fibrillation on postoperative day three requiring  initiation of amiodarone.  She maintained atrial fibrillation and atrial  flutter throughout the hospitalization, but with a relatively good rate  control with rate around 80-110.  [name] did attempt to rapid atrial  pace her on Ma y8, but was unsuccessful and felt she should be considered  for cardioversion in four to six weeks after discharge.  Postoperatively Ms.  Kelley was extubated by postoperative day one.  She did require milrinone  and dopamine initially, but was weaned by postoperative day two.  She had  mild postoperative fluid volume excess that was responsive to diuresis, but  is felt she would require short-term Lasix as well post discharge.  Chest  tubes and invasive monitoring lines were discontinued by postoperative day  four and she was felt appropriate to transfer out of the surgical intensive  care unit to telemetry unit 2000.  She remained on this unit  until discharge.  She remained hemodynamically stable with systolic blood pressure  around 100-125 and diastolic in the 70s and 80s.  Heart rate is discussed  above.  She overall remained afebrile with a few intermittent spikes in her  temperature to 99-100.  She was weaned from supplemental oxygen and was  saturating above 92% on room air.  She had some mild leukocytosis  postoperatively peaking at around 16,000, but was trending down at around  12,000 at discharge.  Her other laboratories also remained stable, although  her TSH was low normal at 0.362 prompting a decrease in her Synthroid dose.  Postoperatively she also developed some small to moderate amount of  serosanguineous drainage from her distal sternal incision  site.  There was  no surrounding erythema and her sternum remained stable, although at  discharge there did appear to be a scant amount of mixed thicker yellow  drainage requiring a one week course of Keflex and local wound care.  Otherwise, she was felt stable for discharge home on postoperative day  seven, Aug 31, 2005.  By the time her bowel and bladder were functioning  appropriately.  She was tolerating a regular diet.  She was ambulating  without difficulty.  Her volume excess was improving, although lower  extremities still showed around 1+ edema.  On examination her other  examination showed her heart rate fairly regular, but telemetry showing  atrial flutter.  Lung sounds showed fine crackles at the left base, but were  otherwise clear.  Her abdominal examination was benign and her leg incisions  were healing well without signs of infection.  Her INR was therapeutic at  2.3 and other recent laboratories showed a white blood count of 12.3,  hemoglobin 9.7, hematocrit 29.3, platelet count 207.  Sodium 136, potassium  4, chloride 99, CO2 28, BUN 8, creatinine 0.9, blood glucose 88.  TSH 0.362  and latest chest x-ray showed mild stable atelectasis and small right   pleural effusion.   DISCHARGE MEDICATIONS:  1.  Aspirin 81 mg p.o. daily.  2.  Lopressor 25 mg p.o. b.i.d.  3.  Lisinopril 2.5 mg daily.  4.  Lipitor 20 mg daily.  5.  Keflex 500 mg p.o. t.i.d. x7 days.  6.  Coumadin 2.5 mg p.o. daily and as instructed.  7.  Synthroid 175 mcg p.o. daily.  8.  Lasix 40 mg p.o. daily x2 weeks.  9.  K-Dur 20 mEq daily x2 weeks.  10. Amiodarone 200 mg p.o. b.i.d.  11. Folic acid 1 mg p.o. daily.  12. Ultram 50 mg one to two tablets p.o. q.6h. p.r.n. pain.   DISCHARGE INSTRUCTIONS:  She is instructed to avoid driving or heavy lifting  more than 10 pounds.  She was encouraged to continue daily breathing and  walking exercises.  She is to follow a low fat, low salt diet.  She is to  clean her sternal incision daily with Betadine and apply gauze dressing  daily and as needed until drainage subsides then she may shower.  She should  call if there is sternal instability or redness noted or she develops fever greater than 101.  She is to have her next PT/INR laboratory draw at  [facility] on Sep 02, 2005 with results to [name] in  [place].  She should also call and schedule two-week follow-up with  him.  She is to see [name] on Sep 15, 2005 at 11:20 a.m. with a  chest x-ray at [facility] one hour before this appointment and  should call sooner if needed.      Jerold Coombe, P.A.      Sheliah Plane, MD  Electronically Signed    AWZ/MEDQ  D:  08/31/2005  T:  09/01/2005  Job:  [id number]   cc:   Danella Maiers, M.D.  Baird Kay, M.D.  [place], [place]

## 2010-09-10 NOTE — Consult Note (Signed)
NAME:  [name], [name] NO.:  [id number]   MEDICAL RECORD NO.:  [id number]           PATIENT TYPE:   LOCATION:                                 FACILITY:   PHYSICIAN:  Sheliah Plane, MD    DATE OF BIRTH:  1945-09-27   DATE OF CONSULTATION:  DATE OF DISCHARGE:                                   CONSULTATION   CARDIOVASCULAR/THORACIC SURGERY CONSULTATION   REFERRING PHYSICIAN:  Dr. Margret Chance, [place]   PRIMARY CARE PHYSICIANS:  Follow up cardiologist: Dr. Georganna Skeans.  Primary care  physician Dr. Dyane Dustman.   REASON FOR CONSULTATION:  Mitral stenosis and coronary artery disease.   HISTORY OF PRESENT ILLNESS:  The patient is a 65 year old female with a  history of rheumatic fever as a child who, until February of this year, was  unaware that she had any significant mitral valve disease.  Several years  ago she had an echocardiogram and was told everything was okay with her  valve.  In late February of this year she was admitted to [facility] with dyspnea symptoms and right pleuritic pain.  The patient  relates that she was told she had a blood clot in her lung and was also  found to have significant mitral stenosis.  She was started on Coumadin and  ultimately was referred for cardiology consultation and cardiac  catheterization.  She has limited exercise tolerance and has increasing  orthopnea and paroxysmal nocturnal dyspnea over the past several months.  On  August 03, 2005 she had an episode of dizziness and presented to the  emergency room in [place].  Her INR was noted to be 7 and she was told  to stop taking Coumadin. An echocardiogram was performed on July 20, 2005  which showed normal left ventricular function with mild global left  ventricular systolic dysfunction with ejection fraction estimated at 45%,  mild concentric left ventricular hypertrophy, moderate to severe valvular  stenosis with a mean gradient of 8.6 and valve  area estimated at 1, mild  mitral regurgitation, moderate tricuspid regurgitation, moderate pulmonary  hypertension.  No pericardial effusion, mild biatrial enlargement, aortic  valve sclerosis with no evidence of stenosis.  The patient underwent cardiac  catheterization by Dr. Georganna Skeans which, in addition to confirming the  diagnosis of critical mitral stenosis, showed the patient to have  significant three vessel coronary artery disease.  She has had no history of  myocardial infarction in the past.  She has had no previous catheterization  or angioplasty.  Cardiac risk factors she denies hypertension.  She is  unaware of her lipid status. She has no diabetes.  She is a long-term smoker  of at least one pack of cigarettes per day for 45 years.   FAMILY HISTORY:  The patient's mother had rheumatoid arthritis and died at  age 60.  A sister has arthritis and diabetes.  One brother has diabetes.  Her father died suddenly at age 80.   PAST MEDICAL HISTORY:  In addition to above, she has a history of  hypothyroidism.  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  Tubal ligation.   SOCIAL HISTORY:  As noted, the patient has been a smoker.  She is a widow;  her husband committed suicide in the fall of 2006.  She has in the past been  employed as a Engineer, maintenance and more recently until February was  working at the [employer].   MEDICATIONS:  At the time she was seen include:  1.  Levothyroxine 175 mg.  2.  Lisinopril 10 mg a day.  3.  Digitek 0.125 mg daily.  4.  Metoprolol 25 mg twice a day.  5.  Lasix 40 mg once a day.  6.  K-Lor daily.  7.  She had been on Coumadin 4 mg a day; stopped on August 03, 2005.  8.  Extra-strength Tylenol p.r.n.   ALLERGIES:  Include PENICILLIN which causes hives.   REVIEW OF SYSTEMS:  CARDIAC review of systems is positive for exertional  shortness of breath, orthopnea, at least one dizzy spell  but no frank  syncope.  She is unaware of palpitations.  She has been in atrial  fibrillation for an unknown length of time.  GENERAL review of systems: She  has lost weight over the past several months on purpose.  She denies fever,  chills or night sweats.  HEENT: Eyes:  She denies amaurosis or transient  ischemic attack's.  PULMONARY: She occasionally has wheezing. Denies any  hemoptysis. GASTROINTESTINAL:  Denies abdominal pain, does complain of  occasional diarrhea with stress.  She denies any blood in her stool.  NEUROLOGICAL:  She does have numbness and tingling in her fingertips and  toes.  She has claudication in both lower extremities;  one is not worse  than the other.   PHYSICAL EXAMINATION:  VITAL SIGNS:  Blood pressure is 82/60, pulse is 76  and irregular,  respiratory rate is 18, oxygen saturation 96% on room air.  GENERAL APPEARANCE:  The patient appears older than her stated age of 69  years.  She has the skin and appearance of a long-term smoker.  NEUROLOGICAL:  She is neurologically intact and able to relay her history.  NECK:  She has no carotid bruits.  LUNGS:  Are clear but with distant breath sounds.  There is no active  wheezing.  CARDIAC:  Examination reveals faint diastolic murmur at the left base.  ABDOMEN: Examination is benign.  EXTREMITIES:  Examination of the lower extremities she has no palpable pedal  pulses in either extremity.  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  This appears adequate for use of vein harvesting in the  right leg.   CLINICAL DATA:  The patient had a carotid study done in [place], [place]  without evidence of critical carotid stenosis.  She has evidence of lower  extremity arterial occlusive disease, right greater than left with indices  of 0.4 on the right and 0.72 on the left.  The patient's cardiac catheterization films are reviewed.  She has total occlusion of the left  anterior descending.  A  moderate sized diagonal with 75% stenosis,  circumflex is moderate size vessel with a small first obtuse marginal  without high grade stenosis.  The right coronary artery is a large dominant  vessel with mid 90% lesion.  She has moderate pulmonary hypertension.   IMPRESSION:  Patient with atrial fibrillation, history of pulmonary  embolus,  significant two vessel coronary artery disease with pulmonary hypertension  and critical mitral stenosis with symptoms of progressive heart failure and  dyspnea on exertion.  I have reviewed these findings with the patient and  have recommended that we proceed with mitral valve replacement, coronary  artery bypass grafting and possible Mays procedure.  The risks of the  procedure, including death, infection, stroke, myocardial infarction,  bleeding, blood transfusion and possible postoperative pneumonia with long  term respiratory failure and ventilation have all been discussed with the  patient and her son in detail.  We have discussed the valve options,  mechanical versus tissue.  Although the patient is in atrial fibrillation,  she prefers not to be committed to long-term Coumadin because of her other  health issues.  It is not unreasonable to  place a tissue valve.  The patient is agreeable with proceeding.  We will  obtain pulmonary function studies and laboratory studies and proceed as soon  as possible since the patient is now off Coumadin, awaiting surgery.  Will  plan surgical procedure on Wednesday, August 17, 2005.      Sheliah Plane, MD  Electronically Signed     EG/MEDQ  D:  08/15/2005  T:  08/15/2005  Job:  [id number]

## 2010-09-10 NOTE — Assessment & Plan Note (Signed)
[facility]                          [facility] OFFICE NOTE   Deannah, Rossi TAYDEM CAVAGNARO                        MRN:          [id number]  DATE:07/10/2006                            DOB:          01-18-1946    HISTORY OF PRESENT ILLNESS:  The patient is a 65 year old female with a  history of mitral stenosis, status post mitral tissue valve replacement  and four vessel coronary artery bypass grafting. The patient also had a  left and right-sided MAZE procedure performed in May of 2007.  Postoperatively, the patient had recurrent atrial fibrillation and was  treated with amiodarone followed by cardioversion in July of 2007.  Currently, the patient has remained in normal sinus rhythm. She reports  no recurrent palpitations. She has no orthopnea, PND. She has no  syncope. She reports a brief episode of substernal chest pain which she  related to gastroesophageal reflux disease. The patient states that  after she burped one time her symptoms resolved. It was a one time event  and there has been no recurrence and no exertional symptoms.   MEDICATIONS:  1. Lipitor 20 mg p.o. q nightly.  2. Coumadin 2 mg p.o. daily.  3. Folic acid 1 mg a day.  4. __________ 75 mcg a day.  5. Metoprolol 25 mg p.o. b.i.d.  6. Aspirin 81 mg a day.   PHYSICAL EXAMINATION:  VITAL SIGNS: Blood pressure is 155/86.  Heart  rate 68 beats per minute. Weight is 211 pounds.  NECK: Normal carotid upstroke. No carotid bruits.  LUNGS:  Clear breath sounds bilaterally.  HEART: Regular rate and rhythm. Normal S1, S2. No murmur, rubs or  gallops.  ABDOMEN: Soft and nontender. No rebound or guarding. Good bowel sounds.  EXTREMITIES: No cyanosis, clubbing or edema.  NEURO: The patient is alert, oriented and grossly nonfocal.   PROBLEM LIST:  1. Status post atypical atrial flutter.  2. History of atrial fibrillation, mitral stenosis.      a.     Status post four vessel coronary artery  bypass graft and       mitral tissue valve procedure, left and right-sided MAZE procedure       in May of 2007.      b.     Status post recurrent atrial fibrillation, treated with       amiodarone and cardioversion, now in normal sinus rhythm.  3. Coumadin anticoagulation.  4. Medical noncompliance.  5. Mild left ventricular dysfunction. Ejection fraction 45%.  6. Rheumatic heart disease.  7. Tobacco use.  8. Hypertension.  9. Pulmonary embolism a year and a half ago.    1. The patient remains in normal sinus rhythm. She has no recurrent      palpitations.  2. The patient can in six weeks come off of Coumadin altogether now      that she remains in normal sinus rhythm and had a MAZE procedure      done.  3. The patient can continue on aspirin.  4. Lisinopril will be reinitiated due to her hypertension. I  reconfirmed her blood pressure on a second reading and it was      158/80 mmHg. I have started lisinopril 5 mg p.o. daily.     Learta Codding, MD,FACC  Electronically Signed    GED/MedQ  DD: 07/10/2006  DT: 07/10/2006  Job #: [id number]

## 2010-09-10 NOTE — Op Note (Signed)
NAME:  [name], [name] NO.:  [id number]   MEDICAL RECORD NO.:  [id number]          PATIENT TYPE:  INP   LOCATION:  2301                         FACILITY:  [facility]   PHYSICIAN:  Sheliah Plane, MD    DATE OF BIRTH:  1945-08-12   DATE OF PROCEDURE:  08/24/2005  DATE OF DISCHARGE:                                 OPERATIVE REPORT   PREOPERATIVE DIAGNOSES:  1.  Critical mitral stenosis.  2.  Coronary occlusive disease.  3.  Atrial fibrillation.   POSTOPERATIVE DIAGNOSES:  1.  Critical mitral stenosis.  2.  Coronary occlusive disease.  3.  Atrial fibrillation.   SURGICAL PROCEDURE:  1.  Coronary artery bypass grafting x4 with the left internal mammary to the      left anterior descending coronary artery, reversed saphenous vein graft      to the diagonal coronary artery, sequential reversed saphenous vein      graft to the acute marginal and posterior descending coronary arteries.  2.  Mitral valve replacement with a St. Jude [employer], model [id number], serial      number [id number], mitral tissue valve prosthesis and left- and right-      side maze procedure with endo-vein harvesting.   SURGEON:  Sheliah Plane, M.D.   FIRST ASSISTANT:  Kerin Perna, M.D.   SECOND ASSISTANT:  Jerold Coombe, P.A.   BRIEF HISTORY:  The patient is a 65 year old female with progressive  shortness of breath, known to be in atrial fibrillation chronically, who was  evaluated by Dr. Georganna Skeans in [place] and found to have mitral stenosis  and severe three-vessel coronary artery disease and chronic atrial  fibrillation.  Valve replacement and coronary artery bypass grafting and  maze were recommended to the patient, who agreed and signed informed  consent.   DESCRIPTION OF PROCEDURE:  The patient underwent general endotracheal  anesthesia without incident.  Her pulmonary artery pressures were noted to  be elevated after placement of a Swan-Ganz catheter.  A transesophageal  echo  was placed and confirmed evidence of severe mitral stenosis with  calcification and fusion of the leaflets to the papillary muscles, causing  severe restriction.  The patient's aortic valve appeared without obvious  abnormality.  The patient was prepped with Betadine and draped in the usual  sterile manner.  A median sternotomy was performed and the left internal  mammary artery was dissected down as a pedicle graft.  The distal artery was  divided and had good free flow.  Using the Guidant Endo-vein Harvesting  System, a segment of vein was harvested from the right thigh and was of  adequate quality and caliber.  The pericardium was opened.  The patient was  systemically heparinized.  The ascending aorta was cannulated.  Superior and  inferior vena caval cannulas were placed directly into the cava and an  aortic root cardioplegia needle was introduced into the ascending aorta.  The patient was placed on cardiopulmonary bypass at 2.4 L/min per sq m.  Sites of the anastomosis was selected and dissected out of the epicardium.  The aortic  cross-clamp was applied and 500 mL of cold blood and potassium  cardioplegia were administered.  Using the bipolar Medtronic ablation  device, left-sided lesions were placed across the base of the left atrial  appendage and the left pulmonary veins.  Attention was then turned to the  coronary grafts.  The diagonal coronary artery was opened and admitted a 1.5-  mm probe.  Distally, using a running 7-0 Prolene, a distal anastomosis was  performed.  The patient had a large dominant right system with a high-grade  stenosis at the junction of the acute marginal and the posterior descending.  Using a single vein, a sequential graft was placed first to the acute  marginal, side to side.  The distal extent of the vein was then carried to  the posterior descending coronary artery, which was opened and was  approximately 1.3 to 1 mm in size.  Using a running 7-0  Prolene, distal  anastomosis was performed.  The left anterior descending coronary artery was  totally occluded and was small.  The vessel was opened and admitted a 1-mm  probe distally.  Using a running 8-0 Prolene, the left internal mammary  artery was anastomosed to the left anterior descending coronary artery with  the additional cold blood cardioplegia administered down the vein grafts and  then to the root.  Attention was then turned to the mitral valve.  Using  Koros retractor, after opening the intra-atrial groove, the mitral valve was  exposed.  The valve, especially the posterior leaflet, was severely  calcified and totally fused to the papillary muscles with a significant  amount of calcium, especially along the posterior commissure.  An adequate  repair was not felt to be possible.  The valve was excised with some  difficulty, especially because of the significant calcification and fusion  of the papillary muscles to the posterior leaflet.  Dissection was carried  out carefully as so not to injure the posterior wall of the left ventricle.  With the valve excised and annulus freed from fusion to the papillary  muscles, the valve was sized for a 31 St. Jude mitral Biocor tissue  prosthesis.  Before implanting the valve, the remainder of the left-sided  maze ablation lesions were done, completing the right pulmonary veins,  crossing lesions between the veins, connecting the base of the atrial  appendage toward the vicinity of the posterior commissure of the mitral  valve; this was done primarily with the unipolar pen.  A 4-0 Prolene was  used to suture and close the left atrial appendage.  No. 2 Tycron pledgeted  sutures were placed circumferentially around the annulus and used to secure  the mitral valve prosthesis in place.  The valve seated well.  A vent was  placed across the valve.  The left atrial appendage was then closed with horizontal mattress 3-0 Prolene suture and a  second layer of running 3-0  Prolene suture.  As the patient was rewarmed, the right-sided lesions were  completed, making a transverse incision across the lateral wall of the  atrium.  Ablation of the right atrial appendage was carried out.  Lesions  towards the superior and inferior vena cava were ablated and using the  monopolar pen, lesions from the right free wall to the coronary sinus and  connecting lesions from the coronary sinus to the inferior vena cava and  tricuspid valve were also completed.  With removal of the bulldog on the  mammary artery, there was rise in myocardial  septal temperature.  Aortic  cross-clamp was removed with a total cross-clamp time of 91 minutes.  The  right atrium was de-aired and closed with a horizontal 4-0 Prolene suture.  A partial-occlusion clamp was placed on the ascending aorta and 2 punch  aortotomies were performed.  Each of the 2 vein grafts were anastomosis to  the ascending aorta.  As the heart rewarmed, she spontaneously converted to  a sinus rhythm and transiently needed atrial pacing for increased rate.  She  was started on milrinone and dopamine infusions.  TEE showed good seating of  the mitral valve and good function of the valve.  With the patient rewarmed,  the right superior pulmonary vein vent was removed.  A Magoon needle was  placed in the ascending aorta for further de-airing.  The patient was then  ventilated and weaned from cardiopulmonary bypass without difficulty.  She  remained hemodynamically stable.  She was decannulated in the usual fashion.  Protamine sulfate was administered.  With the operative field hemostatic, 2  atrial and 2 ventricular pacing wires were applied and graft makers were  applied.  A left pleural tube and a mediastinal Blake drain were left in  place.  The sternum was reapproximated with a #6 stainless steel wire,  fascia closed with interrupted 0 Vicryl, running 3-0 Vicryl in the  subcutaneous tissue,  4-0  subcuticular stitch in the skin edges.  Dry dressings were applied.  Sponge  and needle count as reported as correct at the completion of the procedure.  The patient tolerated the procedure without obvious complication and was  transferred to the surgical intensive care unit for further postoperative  care.  Sheliah Plane, MD  Electronically Signed     EG/MEDQ  D:  08/26/2005  T:  08/26/2005  Job:  [id number]   cc:   Kavin Leech MD   Dr. Judie Petit, [place]

## 2010-12-10 DIAGNOSIS — I10 Essential (primary) hypertension: Secondary | ICD-10-CM | POA: Insufficient documentation

## 2011-01-24 LAB — BASIC METABOLIC PANEL
BUN: 11
BUN: 12
BUN: 15
BUN: 15
BUN: 15
BUN: 15
CO2: 25
CO2: 26
CO2: 26
Calcium: 8.4
Calcium: 8.8
Calcium: 8.9
Calcium: 9.1
Chloride: 93 — ABNORMAL LOW
Chloride: 94 — ABNORMAL LOW
Chloride: 99
Chloride: 99
Creatinine, Ser: 0.59
Creatinine, Ser: 0.76
Creatinine, Ser: 0.8
GFR calc Af Amer: >60
GFR calc Af Amer: >60
GFR calc Af Amer: >60
GFR calc non Af Amer: >60
Glucose, Bld: 326 — ABNORMAL HIGH
Glucose, Bld: 358 — ABNORMAL HIGH
Glucose, Bld: 458 — ABNORMAL HIGH
Potassium: 4.3
Potassium: 4.4
Potassium: 5.8 — ABNORMAL HIGH
Sodium: 126 — ABNORMAL LOW
Sodium: 127 — ABNORMAL LOW
Sodium: 130 — ABNORMAL LOW

## 2011-01-24 LAB — GLUCOSE, CAPILLARY
Glucose-Capillary: 234 — ABNORMAL HIGH
Glucose-Capillary: 235 — ABNORMAL HIGH
Glucose-Capillary: 239 — ABNORMAL HIGH
Glucose-Capillary: 250 — ABNORMAL HIGH
Glucose-Capillary: 257 — ABNORMAL HIGH
Glucose-Capillary: 268 — ABNORMAL HIGH
Glucose-Capillary: 275 — ABNORMAL HIGH
Glucose-Capillary: 289 — ABNORMAL HIGH
Glucose-Capillary: 358 — ABNORMAL HIGH
Glucose-Capillary: 392 — ABNORMAL HIGH
Glucose-Capillary: 243 — ABNORMAL HIGH

## 2011-01-24 LAB — CBC
HCT: 36.1
Hemoglobin: 12.4
Hemoglobin: 13.6
MCHC: 32.9
MCHC: 34.9
MCV: 90.6
MCV: 92.6
MCV: 92.7
MCV: 91.2
Platelets: 249
Platelets: 269
Platelets: 201
RBC: 3.9
RBC: 4.22
RBC: 4.03
WBC: 7.8
WBC: 8.1
WBC: 8.1
WBC: 7.4

## 2011-01-24 LAB — CARDIAC PANEL(CRET KIN+CKTOT+MB+TROPI)
CK, MB: 1.1
CK, MB: 1.1
CK, MB: 1.3
Relative Index: 0.3
Relative Index: INVALID
Relative Index: INVALID
Total CK: 216 — ABNORMAL HIGH
Total CK: 411 — ABNORMAL HIGH
Total CK: 73
Troponin I: 0.01
Troponin I: 0.02

## 2011-01-24 LAB — COMPREHENSIVE METABOLIC PANEL
ALT: 128 — ABNORMAL HIGH
AST: 97 — ABNORMAL HIGH
Albumin: 3.2 — ABNORMAL LOW
Alkaline Phosphatase: 69
CO2: 28
Chloride: 101
GFR calc Af Amer: >60
GFR calc non Af Amer: >60
Potassium: 4.9
Sodium: 135
Total Bilirubin: 1.5 — ABNORMAL HIGH

## 2011-01-24 LAB — HEPARIN LEVEL (UNFRACTIONATED)
Heparin Unfractionated: 0.14 — ABNORMAL LOW
Heparin Unfractionated: 0.28 — ABNORMAL LOW

## 2011-01-24 LAB — BLOOD GAS, ARTERIAL
Acid-Base Excess: 0.1
Bicarbonate: 24.3 — ABNORMAL HIGH
Drawn by: 155271
O2 Content: 2
O2 Saturation: 97.5
TCO2: 25.5
pO2, Arterial: 128 — ABNORMAL HIGH

## 2011-01-24 LAB — CK TOTAL AND CKMB (NOT AT ARMC)
CK, MB: 0.9
Relative Index: INVALID
Total CK: <7 — ABNORMAL LOW

## 2011-01-24 LAB — MAGNESIUM
Magnesium: 2.1
Magnesium: 2.7 — ABNORMAL HIGH

## 2011-01-24 LAB — PROTIME-INR
INR: 1.3
INR: 1.6 — ABNORMAL HIGH
Prothrombin Time: 16.2 — ABNORMAL HIGH
Prothrombin Time: 20 — ABNORMAL HIGH
Prothrombin Time: 27 — ABNORMAL HIGH

## 2011-01-24 LAB — LIPID PANEL: VLDL: UNDETERMINED

## 2011-07-14 DIAGNOSIS — R55 Syncope and collapse: Secondary | ICD-10-CM | POA: Insufficient documentation

## 2011-10-19 ENCOUNTER — Encounter: Payer: Self-pay | Admitting: Cardiology

## 2013-11-06 DIAGNOSIS — E139 Other specified diabetes mellitus without complications: Secondary | ICD-10-CM | POA: Insufficient documentation

## 2014-01-09 DIAGNOSIS — Z9889 Other specified postprocedural states: Secondary | ICD-10-CM | POA: Insufficient documentation

## 2014-01-09 DIAGNOSIS — Z952 Presence of prosthetic heart valve: Secondary | ICD-10-CM | POA: Insufficient documentation

## 2014-01-09 DIAGNOSIS — Z0000 Encounter for general adult medical examination without abnormal findings: Secondary | ICD-10-CM | POA: Insufficient documentation

## 2014-04-09 DIAGNOSIS — B351 Tinea unguium: Secondary | ICD-10-CM | POA: Insufficient documentation

## 2014-04-09 DIAGNOSIS — L603 Nail dystrophy: Secondary | ICD-10-CM | POA: Insufficient documentation

## 2014-06-25 ENCOUNTER — Telehealth: Payer: Self-pay | Admitting: *Deleted

## 2014-06-25 NOTE — Telephone Encounter (Signed)
Dr. Lily KocherGomez called for the ED @ [facility] after seeing Mrs. [name]. He had finished treating her but she was c/o sharp pain in the sternal area of her previous CABG/MVR in 2007 by Dr. Tyrone SageGerhardt.  A chest xray was negative for any obvious abnormalities.  He suggested she follow up with Dr,. Gerhardt for this issue.  I said for her to bring a CD of her chest xray with her and I would call her with an appointment.  He agreed.

## 2014-06-30 ENCOUNTER — Other Ambulatory Visit: Payer: Self-pay | Admitting: Cardiothoracic Surgery

## 2014-06-30 DIAGNOSIS — Z951 Presence of aortocoronary bypass graft: Secondary | ICD-10-CM

## 2014-07-02 DIAGNOSIS — R42 Dizziness and giddiness: Secondary | ICD-10-CM | POA: Insufficient documentation

## 2014-07-03 ENCOUNTER — Ambulatory Visit: Payer: Self-pay | Admitting: Cardiothoracic Surgery

## 2014-07-03 DIAGNOSIS — Z951 Presence of aortocoronary bypass graft: Secondary | ICD-10-CM | POA: Insufficient documentation

## 2014-07-03 DIAGNOSIS — I2510 Atherosclerotic heart disease of native coronary artery without angina pectoris: Secondary | ICD-10-CM | POA: Insufficient documentation

## 2014-07-03 DIAGNOSIS — E119 Type 2 diabetes mellitus without complications: Secondary | ICD-10-CM

## 2014-07-03 DIAGNOSIS — R200 Anesthesia of skin: Secondary | ICD-10-CM | POA: Insufficient documentation

## 2014-07-03 DIAGNOSIS — I482 Chronic atrial fibrillation, unspecified: Secondary | ICD-10-CM | POA: Insufficient documentation

## 2014-07-03 DIAGNOSIS — E668 Other obesity: Secondary | ICD-10-CM | POA: Insufficient documentation

## 2014-07-03 DIAGNOSIS — I440 Atrioventricular block, first degree: Secondary | ICD-10-CM | POA: Insufficient documentation

## 2014-07-03 HISTORY — DX: Chronic atrial fibrillation, unspecified: I48.20

## 2014-07-03 HISTORY — DX: Presence of aortocoronary bypass graft: Z95.1

## 2014-07-03 HISTORY — DX: Type 2 diabetes mellitus without complications: E11.9

## 2014-07-08 ENCOUNTER — Ambulatory Visit (INDEPENDENT_AMBULATORY_CARE_PROVIDER_SITE_OTHER): Payer: Medicare Other | Admitting: Cardiothoracic Surgery

## 2014-07-08 ENCOUNTER — Encounter: Payer: Self-pay | Admitting: Cardiothoracic Surgery

## 2014-07-08 ENCOUNTER — Ambulatory Visit
Admission: RE | Admit: 2014-07-08 | Discharge: 2014-07-08 | Disposition: A | Discharge status: Home / Self Care | Payer: Medicare Other | Source: Ambulatory Visit | Attending: Cardiothoracic Surgery | Admitting: Cardiothoracic Surgery

## 2014-07-08 VITALS — BP 123/80 | HR 103 | Resp 16 | Ht 64.0 in | Wt 200.0 lb

## 2014-07-08 DIAGNOSIS — R0782 Intercostal pain: Secondary | ICD-10-CM

## 2014-07-08 DIAGNOSIS — T84218A Breakdown (mechanical) of internal fixation device of other bones, initial encounter: Secondary | ICD-10-CM

## 2014-07-08 DIAGNOSIS — Z952 Presence of prosthetic heart valve: Secondary | ICD-10-CM

## 2014-07-08 DIAGNOSIS — Z951 Presence of aortocoronary bypass graft: Secondary | ICD-10-CM

## 2014-07-08 DIAGNOSIS — Z9889 Other specified postprocedural states: Secondary | ICD-10-CM

## 2014-07-08 DIAGNOSIS — Z8679 Personal history of other diseases of the circulatory system: Secondary | ICD-10-CM

## 2014-07-08 DIAGNOSIS — Z954 Presence of other heart-valve replacement: Secondary | ICD-10-CM

## 2014-07-08 DIAGNOSIS — T84498A Other mechanical complication of other internal orthopedic devices, implants and grafts, initial encounter: Secondary | ICD-10-CM

## 2014-07-08 NOTE — Progress Notes (Signed)
[address]       [place] [address]             [phone]                    Docia Furl [facility] Medical Record #[id number] Date of Birth: 09-Dec-1945  Referring: Ronny Bacon, MD Primary Care: No primary care provider on file.  Chief Complaint:    Chief Complaint  Patient presents with  . Chest Pain    c/o chest discomfort at sternal incision site, CXR prior s/p CABG/ MVR 2007     History of Present Illness:    [name] 69 y.o. female is seen in the office  today for painful sternal wire. The patient in May 2007 presented with critical mitral stenosis and coronary occlusive disease at that time she underwent coronary artery bypass grafting 4 with left internal mammary to left anterior descending coronary artery and reverse saphenous vein graft to the diagonal coronary artery reverse saphenous vein graft to the acute marginal and reverse saphenous vein graft to the posterior descending coronary artery , a mitral valve replacement with a tissue valve St. Jude Medical model V1031 M0 0 serial number [id number] and right and left sided maze procedure with Endo vein harvesting was performed. Over the years the patient had gone back into atrial fibrillation and is now on Elliquis. She was recently seen in the [facility] for a transient neurologic change with numbness in her left face. CT scan of the head MRI of the head and echocardiograms were performed. At the time the patient also complained of 3 weeks of a painful sternal incision. She comes in today to have the sternum checked.      Current Activity/ Functional Status:  Patient is independent with mobility/ambulation, transfers, ADL's, IADL's.   Zubrod Score: At the time of surgery this patient's most appropriate activity status/level should be described as: []     0    Normal activity, no symptoms []     1    Restricted in physical strenuous activity but  ambulatory, able to do out light work [x]     2    Ambulatory and capable of self care, unable to do work activities, up and about               >50 % of waking hours                              []     3    Only limited self care, in bed greater than 50% of waking hours []     4    Completely disabled, no self care, confined to bed or chair []     5    Moribund   Past medical history: Includes coronary artery and mitral valve disease is noted, history of rheumatic fever history of pulmonary emboli history of diabetes  Past surgical history as noted above   History   Social History  . Marital Status: Widowed    Spouse Name: N/A  . Number of Children: N/A  . Years of Education: N/A   Occupational History  . Not on file.   Social History Main Topics  . Smoking status: Not on file  . Smokeless tobacco: Not on file  . Alcohol Use: Not on file  . Drug Use: Not on file  . Sexual  Activity: Not on file   Other Topics Concern  . Not on file   Social History Narrative  . No narrative on file    History  Smoking status  . Not on file  Smokeless tobacco  . Not on file    History  Alcohol Use: Not on file     Allergies  Allergen Reactions  . Niacin And Related Hives  . Penicillins Hives    Current Outpatient Prescriptions  Medication Sig Dispense Refill  . apixaban (ELIQUIS) 5 MG TABS tablet Take 5 mg by mouth 2 (two) times daily.    Marland Kitchen atorvastatin (LIPITOR) 20 MG tablet Take 20 mg by mouth daily.    . Fish Oil OIL Take by mouth 2 (two) times daily.     . insulin aspart (NOVOLOG) cartridge Inject into the skin 3 (three) times daily with meals. 12 units breakfast, 10 units, lunch, 8 units dinner    . insulin glargine (LANTUS) 100 UNIT/ML injection Inject 36 Units into the skin at bedtime.    Marland Kitchen levothyroxine (SYNTHROID, LEVOTHROID) 100 MCG tablet Take 100 mcg by mouth daily before breakfast.    . lisinopril-hydrochlorothiazide (PRINZIDE,ZESTORETIC) 10-12.5 MG per tablet  Take 1 tablet by mouth daily.    . metoprolol (LOPRESSOR) 50 MG tablet Take 50 mg by mouth 2 (two) times daily.    . naproxen sodium (ANAPROX) 220 MG tablet Take 220 mg by mouth 2 (two) times daily with a meal.    . omeprazole (PRILOSEC) 20 MG capsule Take 20 mg by mouth 2 (two) times daily before a meal.     No current facility-administered medications for this visit.      Review of Systems:     Cardiac Review of Systems: Y or N  Chest Pain [  y  ]  Resting SOB [ y  ] Exertional SOB  Cove.Etienne  ]  Orthopnea [ n ]   Pedal Edema Cove.Etienne   ]    Palpitations Cove.Etienne  ] Syncope  Milo.Brash  ]   Presyncope [  n ]  General Review of Systems: [Y] = yes [  ]=no Constitional: recent weight change [n  ];  Wt loss over the last 3 months [   ] anorexia [  ]; fatigue [  ]; nausea [  ]; night sweats [  ]; fever [  ]; or chills [  ];          Dental: poor dentition[  ]; Last Dentist visit:   Eye : blurred vision [n  ]; diplopia [   ]; vision changes [  ];  Amaurosis fugax[ n ]; Resp: cough [  ];  wheezing[  ];  hemoptysis[  ]; shortness of breath[  ]; paroxysmal nocturnal dyspnea[  ]; dyspnea on exertion[  ]; or orthopnea[  ];  GI:  gallstones[  ], vomiting[  ];  dysphagia[  ]; melena[  ];  hematochezia [  ]; heartburn[  ];   Hx of  Colonoscopy[  ]; GU: kidney stones [  ]; hematuria[  ];   dysuria [  ];  nocturia[  ];  history of     obstruction [  ]; urinary frequency [  ]             Skin: rash, swelling[  ];, hair loss[  ];  peripheral edema[  ];  or itching[  ]; Musculosketetal: myalgias[  ];  joint swelling[  ];  joint erythema[  ];  joint pain[  ];  back pain[  ];  Heme/Lymph: bruising[  ];  bleeding[  ];  anemia[  ];  Neuro: TIA[ y ];  headaches[  ];  stroke[  ];  vertigo[  ];  seizures[  ];   paresthesias[  ];  difficulty walking[y  ];  Psych:depression[y  ]; anxiety[  ];  Endocrine: diabetes[y  ];  thyroid dysfunction[  ];  Immunizations: Flu up to date [ ? ]; Pneumococcal up to date [ ? ];  Other:  Physical  Exam: BP 123/80 mmHg  Pulse 103  Resp 16  Ht 5\' 4"  (1.626 m)  Wt 200 lb (90.719 kg)  BMI 34.31 kg/m2  SpO2 96%  PHYSICAL EXAMINATION: General appearance: alert, cooperative and appears older than stated age Head: Normocephalic, without obvious abnormality, atraumatic Neck: no adenopathy, no carotid bruit, no JVD, supple, symmetrical, trachea midline and thyroid not enlarged, symmetric, no tenderness/mass/nodules Lymph nodes: Cervical, supraclavicular, and axillary nodes normal. Resp: clear to auscultation bilaterally Back: symmetric, no curvature. ROM normal. No CVA tenderness. Cardio: irregularly irregular rhythm GI: soft, non-tender; bowel sounds normal; no masses,  no organomegaly Extremities: extremities normal, atraumatic, no cyanosis or edema and Homans sign is negative, no sign of DVT On exam of the sternum: The sternum appears stable though there is a slight ridge along the midline of the sternum suggestive of possible nonunion no with cough I can not elicit significant movement. There is no palpable or tender or protruding sternal wire that would be amenable to removing Diagnostic Studies & Laboratory data:     Recent Radiology Findings:   Dg Chest 2 View  07/08/2014   CLINICAL DATA:  Status post CABG. Patient feels the sternal wire is poking her.  EXAM: CHEST  2 VIEW  COMPARISON:  01/20/2008  FINDINGS: Vertical metallic sternal sutures show interval fracture in 2 places, without notable displacement. There is borderline cardiomegaly which is stable from prior. Negative aortic and hilar contours post CABG. Mild scarring is again noted in the left mid and lower lung. There is no edema, consolidation, effusion, or pneumothorax.  IMPRESSION: 1. Vertical sternal wire fracture which is new from 2009 and notable due to the history. 2. Otherwise, stable chest.   Electronically Signed   By: Marnee SpringJonathon  Watts M.D.   On: 07/08/2014 15:02     I have independently reviewed the above radiologic  studies, and reviewed them with the patient   Assessment / Plan:   Patient comes to the office with question of painful sternal wire after being admitted for TIA in hospital in [place]. 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 On review of the chest x-ray the single vertical weave wire on the right side has a fracture and it the double sternal wires of the remaining incision are all intact and not out of place. I discussed the diagnosis with the patient that there are no significantly disrupted wires need to be removed. I discussed with her the possibility that she has a nonunion but now 8 years after surgery I would not recommend any definitive surgery at this point. I cautioned her to be careful about heavy lifting. She did not wish to pursue any further evaluation or treatment at this time. I did discuss with her if this should persist or become more uncomfortable to the point where it's limiting her we could consider CT scan of the chest to confirm the diagnosis and sternal plating could be  considered. She will call back for appointment as needed.      I  spent 30 minutes counseling the patient face to face and 50% or more the  time was spent in counseling and coordination of care. The total time spent in the appointment was 45 minutes.  Delight Ovens MD      [address] [place] [place] [address] Office [phone]   Beeper [phone]  07/08/2014 5:34 PM

## 2014-08-04 DIAGNOSIS — E669 Obesity, unspecified: Secondary | ICD-10-CM | POA: Insufficient documentation

## 2014-09-10 DIAGNOSIS — R42 Dizziness and giddiness: Secondary | ICD-10-CM | POA: Insufficient documentation

## 2015-09-05 DIAGNOSIS — I050 Rheumatic mitral stenosis: Secondary | ICD-10-CM

## 2015-09-05 HISTORY — DX: Rheumatic mitral stenosis: I05.0

## 2015-09-09 DIAGNOSIS — K219 Gastro-esophageal reflux disease without esophagitis: Secondary | ICD-10-CM | POA: Insufficient documentation

## 2015-09-11 DIAGNOSIS — I5033 Acute on chronic diastolic (congestive) heart failure: Secondary | ICD-10-CM | POA: Insufficient documentation

## 2015-09-15 ENCOUNTER — Encounter: Payer: Self-pay | Admitting: Cardiothoracic Surgery

## 2015-09-15 ENCOUNTER — Ambulatory Visit (INDEPENDENT_AMBULATORY_CARE_PROVIDER_SITE_OTHER): Payer: Self-pay | Admitting: Cardiothoracic Surgery

## 2015-09-15 VITALS — BP 124/70 | HR 82 | Resp 20 | Ht 64.0 in | Wt 207.0 lb

## 2015-09-15 DIAGNOSIS — I5032 Chronic diastolic (congestive) heart failure: Secondary | ICD-10-CM

## 2015-09-15 DIAGNOSIS — I050 Rheumatic mitral stenosis: Secondary | ICD-10-CM

## 2015-09-15 DIAGNOSIS — Z952 Presence of prosthetic heart valve: Secondary | ICD-10-CM

## 2015-09-15 NOTE — Progress Notes (Signed)
[address]       [place] [address]             [phone]                    Docia F[facility] Medical Record #[id number] Date of Birth: 03-16-1946  Referring: Ronny Bacon, MD Primary Care: No primary care provider on file.  Chief Complaint:    Chief Complaint  Patient presents with  . Mitral Regurgitation    History of Present Illness:    [name] 70 y.o. female is seen in the office At the patient's request for second opinion concerning redo mitral valve surgery. In 2007 the patient had undergone urgent coronary artery bypass grafting and mitral valve replacement with a tissue valve and Maze procedure for long-standing critical mitral stenosis recently the patient had increasing symptoms of congestive heart failure and was hospitalized in [facility].  Echocardiogram was performed and she was transferred to [facility] patient was seen by the heart failure team cardiac catheterization was performed.  she was brought here today by her family for an additional opinion about options and care. The patient is fairly limited both from a cardiac standpoint but also because of mobility issue with significant arthritis and gait instability. 08/24/2005 OPERATIVE REPORT PREOPERATIVE DIAGNOSES: 1. Critical mitral stenosis. 2. Coronary occlusive disease. 3. Atrial fibrillation. POSTOPERATIVE DIAGNOSES: 1. Critical mitral stenosis. 2. Coronary occlusive disease. 3. Atrial fibrillation. SURGICAL PROCEDURE: 1. Coronary artery bypass grafting x4 with the left internal mammary to the  left anterior descending coronary artery, reversed saphenous vein graft  to the diagonal coronary artery, sequential reversed saphenous vein  graft to the acute marginal and posterior descending coronary arteries. 2. Mitral valve replacement with a [employer], model [id number], serial  number [id number], mitral tissue  valve prosthesis and left- and right-  side maze procedure with endo-vein harvesting. SURGEON: Sheliah Plane, M.D.   Current Activity/ Functional Status:  Patient is independent with mobility/ambulation, transfers, ADL's, IADL's., but very frail, trouble walking needs help and cane    Zubrod Score: At the time of surgery this patient's most appropriate activity status/level should be described as: []     0    Normal activity, no symptoms []     1    Restricted in physical strenuous activity but ambulatory, able to do out light work []     2    Ambulatory and capable of self care, unable to do work activities, up and about               >50 % of waking hours                              [x]     3    Only limited self care, in bed greater than 50% of waking hours []     4    Completely disabled, no self care, confined to bed or chair []     5    Moribund  Surgery Date Laterality Comments  BILAT TUBAL DIVISION NEC     HAND SKIN GRAFT NEC     HX MITRAL VALVE REPLACEMENT     REMV CATARACT EXTRACAP,INSERT LENS,COMP 06/19/2012 Left Procedure: EYE, CATARACT EXTRACTION, PHACO WITH IOL IMPLANT; Surgeon: Michaell Cowing, MD; Location: [facility] MAIN OR  HX TUBAL LIGATION     REMV CATARACT EXTRACAP,INSERT LENS,COMP 07/17/2012 Right  Procedure: EYE, CATARACT EXTRACTION, PHACO WITH IOL IMPLANT; Surgeon: Michaell Cowing, MD; Location: [facility] MAIN OR  HX HEART CATHETERIZATION     HX CORONARY ARTERY BYPASS GRAFT   quadruple  HX CATARACT REMOVAL 2/14 Bilateral w/lens implant  HX TONSILLECTOMY     Medical History Medical History Date Comments  DM (diabetes mellitus) (HCC)    Hyperlipidemia LDL goal < 70    CAD (coronary artery disease) of bypass graft    Paroxysmal a-fib (HCC)    Diabetes (HCC)    High cholesterol    Coronary artery disease    Edema    Diverticulitis    Stiff joint    Arthritis    Dizziness    Vertigo    A-fib (HCC)    Pulmonary  embolism (HCC)    Pneumonia    Joint pain    Unsteady gait    Anemia    Depression    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 H/O coronary artery bypass surgery 07/03/2014  . Mitral stenosis 09/05/2015    Overview:  Mod- Severe stenosis of MV prosthesis   . Chronic atrial fibrillation (HCC) 07/03/2014  . Diabetes mellitus, type 2 (HCC) 07/03/2014    No past surgical history on file.  No family history on file.  Social History   Social History  . Marital Status: Widowed    Spouse Name: N/A  . Number of Children: N/A  . Years of Education: N/A   Occupational History  . Not on file.   Social History Main Topics  . Smoking status: Former Smoker    Quit date: 08/25/2015  . Smokeless tobacco: Not on file  . Alcohol Use: 0.0 oz/week    0 Standard drinks or equivalent per week     Comment: wine  . Drug Use: No  . Sexual Activity: Not on file   Other Topics Concern  . Not on file   Social History Narrative    History  Smoking status  . Former Smoker  . Quit date: 08/25/2015  Smokeless tobacco  . Not on file    History  Alcohol Use  . 0.0 oz/week  . 0 Standard drinks or equivalent per week    Comment: wine     Allergies  Allergen Reactions  . Niacin And Related Hives  . Penicillins Hives    Current Outpatient Prescriptions  Medication Sig Dispense Refill  . apixaban (ELIQUIS) 5 MG TABS tablet Take 5 mg by mouth 2 (two) times daily.    Marland Kitchen apixaban (ELIQUIS) 5 MG TABS tablet Take by mouth.    Marland Kitchen aspirin EC 81 MG tablet Take by mouth.    Marland Kitchen atorvastatin (LIPITOR) 20 MG  tablet Take 20 mg by mouth daily.    Marland Kitchen atorvastatin (LIPITOR) 20 MG tablet Take by mouth.    . bumetanide (BUMEX) 2 MG tablet Take by mouth.    . Fish Oil OIL Take by mouth 2 (two) times daily.     . insulin aspart (NOVOLOG FLEXPEN) 100 UNIT/ML FlexPen     .  insulin aspart (NOVOLOG) cartridge Inject into the skin 3 (three) times daily with meals. 12 units breakfast, 10 units, lunch, 8 units dinner    . Insulin Glargine (LANTUS SOLOSTAR) 100 UNIT/ML Solostar Pen     . insulin glargine (LANTUS) 100 UNIT/ML injection Inject 36 Units into the skin at bedtime.    . Insulin Pen Needle (FIFTY50 PEN NEEDLES) 31G X 5 MM MISC by Does not apply route.    Marland Kitchen. levothyroxine (SYNTHROID, LEVOTHROID) 100 MCG tablet Take 100 mcg by mouth daily before breakfast.    . levothyroxine (SYNTHROID, LEVOTHROID) 175 MCG tablet Take by mouth.    Marland Kitchen. lisinopril-hydrochlorothiazide (PRINZIDE,ZESTORETIC) 10-12.5 MG per tablet Take 1 tablet by mouth daily.    Marland Kitchen. loperamide (IMODIUM) 2 MG capsule Take by mouth.    . metoprolol (LOPRESSOR) 50 MG tablet Take 50 mg by mouth 2 (two) times daily.    . metoprolol (LOPRESSOR) 50 MG tablet Take by mouth.    Marland Kitchen. omeprazole (PRILOSEC) 20 MG capsule Take 20 mg by mouth 2 (two) times daily before a meal.    . omeprazole (PRILOSEC) 20 MG capsule Take by mouth.    . sertraline (ZOLOFT) 50 MG tablet     . spironolactone (ALDACTONE) 25 MG tablet Take by mouth.    . Omega-3 Fatty Acids ([employer] FISH OIL) 1200 MG CAPS Take by mouth.     No current facility-administered medications for this visit.      Review of Systems:     Cardiac Review of Systems: Y or N  Chest Pain [ N   ]  Resting SOB [  Y ] Exertional SOB  [Y  ]  Orthopnea Gilian.Kraft[Y  ]   Pedal Edema [ Y  ]    Palpitations [Y ] Syncope  Klaus.Mock[N  ]   Presyncope Gilian.Kraft[Y   ]  General Review of Systems: [Y] = yes [  ]=no Constitional: recent weight change [  ];  Wt loss over the last 3 months [   ] anorexia [  ]; fatigue [Y  ]; nausea [  ]; night sweats [  ];  fever [  ]; or chills [  ];          Dental: poor dentition[  ]; Last Dentist visit:   Eye : blurred vision [  ]; diplopia [   ]; vision changes [  ];  Amaurosis fugax[  ]; Resp: cough [  ];  wheezing[  ];  hemoptysis[  ]; shortness of breath[  ]; paroxysmal nocturnal dyspnea[  ]; dyspnea on exertion[  ]; or orthopnea[  ];  GI:  gallstones[  ], vomiting[  ];  dysphagia[  ]; melena[  ];  hematochezia [  ]; heartburn[  ];   Hx of  Colonoscopy[  ]; GU: kidney stones [  ]; hematuria[  ];   dysuria [  ];  nocturia[  ];  history of     obstruction [  ]; urinary frequency [  ]             Skin: rash, swelling[  ];, hair loss[  ];  peripheral edema[  ];  or itching[  ]; Musculosketetal: myalgias[Y  ];  joint swelling[  ];  joint erythema[  ];  joint pain[Y  ];  back pain[Y  ];  Heme/Lymph: bruising[ Y ];  bleeding[  ];  anemia[  ];  Neuro: TIA[  ];  headaches[  ];  stroke[  ];  vertigo[  ];  seizures[N  ];  paresthesias[  ];  difficulty walking[ Y ];  Psych:depression[  ]; anxiety[  ];  Endocrine: diabetes[ Y ];  thyroid dysfunction[Y  ];  Immunizations: Flu up to date [  ]; Pneumococcal up to date [  ];  Other:  Physical Exam: BP 124/70 mmHg  Pulse 82  Resp 20  Ht 5\' 4"  (1.626 m)  Wt 207 lb (93.895 kg)  BMI 35.51 kg/m2  SpO2 95%  PHYSICAL EXAMINATION: General appearance: alert, cooperative, appears older than stated age and fatigued Head: Normocephalic, without obvious abnormality, atraumatic Neck: no adenopathy, no carotid bruit, no JVD, supple, symmetrical, trachea midline and thyroid not enlarged, symmetric, no tenderness/mass/nodules Lymph nodes: Cervical, supraclavicular, and axillary nodes normal. Resp: diminished breath sounds bibasilar Back: symmetric, no curvature. ROM normal. No CVA tenderness. Cardio: irregularly irregular rhythm GI: soft, non-tender; bowel sounds normal; no masses,  no organomegaly Extremities: varicose veins noted left leg, removed from right leg and venous  stasis dermatitis noted Neurologic: Grossly normal +1 pedal edema bilaterial  Diagnostic Studies & Laboratory data:     Recent Radiology Findings:   No results found.   I have independently reviewed the above radiologic studies.  Recent Lab Findings: Lab Results  Component Value Date   WBC 7.4 01/24/2008   HGB 12.8 01/24/2008   HCT 36.8 01/24/2008   PLT 201 01/24/2008   GLUCOSE 227* 01/24/2008   CHOL * 01/19/2008    633        ATP III CLASSIFICATION:  <200     mg/dL   Desirable  469-629  mg/dL   Borderline High  >=528    mg/dL   High   TRIG 4132 RESULTS CONFIRMED BY MANUAL DILUTION* 01/19/2008   HDL NOT REPORTED DUE TO HIGH TRIGLYCERIDES 01/19/2008   LDLCALC UNABLE TO CALCULATE IF TRIGLYCERIDE OVER 400 mg/dL 44/04/270   ALT 536* 64/40/3474   AST 97* 01/24/2008   NA 135 01/24/2008   K 4.9 SLIGHT HEMOLYSIS LIPEMIC SPECIMEN 01/24/2008   CL 101 01/24/2008   CREATININE 0.65 01/24/2008   BUN 11 01/24/2008   CO2 28 01/24/2008   TSH * 01/20/2008    38.646 (NOTE) Specimen ultracentrifuged due to lipemia. Test methodology is 3rd generation TSH   INR 2.4* 01/24/2008   HGBA1C * 01/19/2008    10.8 (NOTE)   The ADA recommends the following therapeutic goal for glycemic   control related to Hgb A1C measurement:   Goal of Therapy:   < 7.0% Hgb A1C   Reference: American Diabetes Association: Clinical Practice   Recommendations 2008, Diabetes Care,  2008, 31:(Suppl 1).   ECHO: TEE [facility]  -loaded into [facility] system Procedure:          Transesophageal Echo with color Doppler Indication(s):      Known Valve Disease Referring Provider: B. Brent Bulla MD Sonographer:        Larene Pickett MS,DCS   Summary:  1. Overall left ventricular ejection fraction is estimated at 60 to 65%.  2. Mildly reduced RV systolic function.  3. Moderately increased RV wall thickness.  4. Mildly dilated left atrium.  5. Severely dilated right atrium.  6. The anterior leaflet of the mitral prosthesis appears  fixed and immobile. the posetior cusp is restricted but has some opening The mean gradient was 9 mmHG with a peak of 18 mmHG suggesting severe stenosis of the prosthesis     There is a small jet of MR through then degererated valve.  7. Moderate tricuspid regurgitation.  PROCEDURE: After discussion of  the risks and benefits of the TEE, an informed consent was obtained. Anesthesia services provided by staff from the department of anesthesia. The TEE probe was passed without difficulty by the Cardiologist. The patient's vital signs; including heart rate, blood pressure, and oxygen saturation; remained stable throughout the procedure. The patient tolerated the procedure well and without complications.  Left Ventricle: Overall left ventricular ejection fraction is estimated at 60 to 65%.  Right Ventricle: The right ventricular size is normal. RV wall thickness is moderately increased. Global RV systolic function is mildly reduced.  Left Atrium: The left atrium is mildly dilated. The left atrial appendage is well visualized and there is no evidence of thrombus present. LAA appears ligated.  Right Atrium: The right atrium is severely dilated.  Pericardium: There is no evidence of pericardial effusion.  Mitral Valve: Trace mitral valve regurgitation is seen. The anterior leaflet of the mitral prosthesis appears fixed and immobile. the posetior cusp is restricted but has some opening The mean gradient was 9 mmHG with a peak of 18 mmHG suggesting severe stenosis of the prosthesis There is a small jet of MR through then degererated valve.  Tricuspid Valve: The tricuspid valve is normal in structure. Moderate tricuspid regurgitation is visualized.  Aortic Valve: The aortic valve appears trileaflet. There is mild aortic valve sclerosis. Trace aortic valve regurgitation is seen.  Pulmonic Valve: The pulmonic valve was not well visualized.  Aorta: The aortic root appears normal in size and  structure.  Shunts: There is no evidence of a patent foramen ovale. There is no evidence of an atrial septal defect. No ventricular septal defect is seen.   Mitral Valve: MV Max Vel:   1.60 m/s MV Mean Grad: 6.0 mmHg Electronically signed by Marene Lenz, MD Signature Date/Time: 09/04/2015/2:12:17 PM  CATH: [facility] Requesting disk Result Narrative  1. Patent LIMA graft to LAD. 2. Patent SVG to Diagonal branch. 3. Patent SVG to Right coronary artery.  4. Normal left ventricular end diastolic pressures.  5. Moderate to severe mitral stenosis, mean gradient 8 mmHg, mitral valve  area 1.38 cm2. 6. Moderate to severe pulmonary hypertension. Peak PA pressure 53 mmHg.    Assessment / Plan:   Stage III congestive heart failure, with evidence of moderate to severe mitral stenosis secondary to prosthetic valve calcification with evidence of severe pulmonary hypertension, patent grass from previous coronary artery bypass. Over that although patient is extremely frail a redo operation carry significant morbidity and mortality regardless if it was by sternotomy or right thoracotomy. I discussed with the family the need for at this point aggressive medical therapy for congestive heart failure. I plan to see her back in 3-4 weeks and see how she progresses.     I  spent 40 minutes counseling the patient face to face and 50% or more the  time was spent in counseling and coordination of care. The total time spent in the appointment was 60 minutes.  Delight Ovens MD      [address] [place] [place] [address] Office [phone]   Beeper [phone]  09/15/2015 8:42 PM

## 2015-10-02 ENCOUNTER — Telehealth: Payer: Self-pay | Admitting: *Deleted

## 2015-10-02 NOTE — Telephone Encounter (Signed)
Called patient to check up on status and the plan of care.  Left VM on her phone.  I will update Dr. Tyrone SageGerhardt once she calls back.

## 2015-10-14 IMAGING — CR DG CHEST 2V
2 series · 2 of 2 positions shown · non-contrast
Comparison: 01/20/2008

CLINICAL DATA: Status post CABG. Patient feels the sternal wire is
poking her.

EXAM:
CHEST  2 VIEW

[w chest pa]
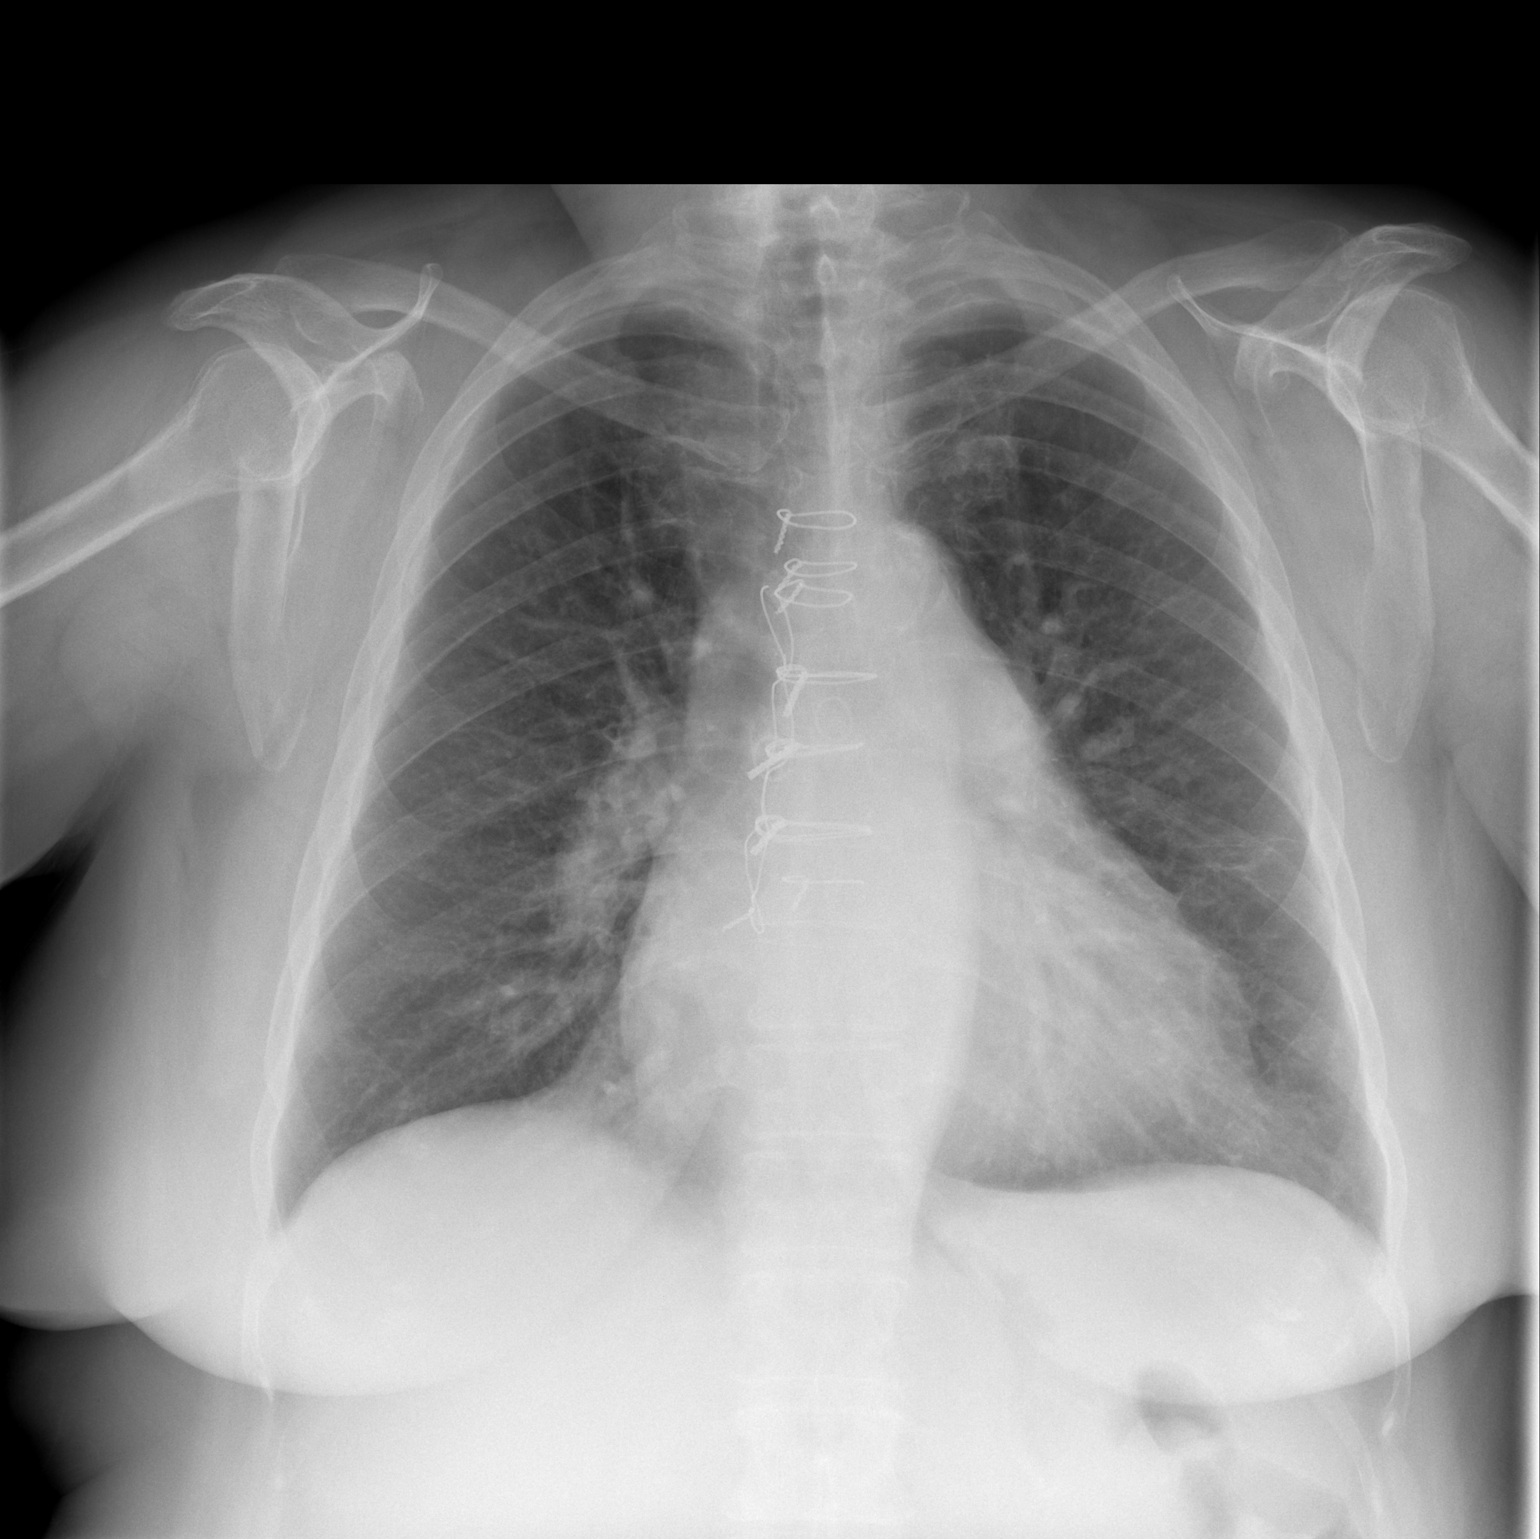

[w chest lat]
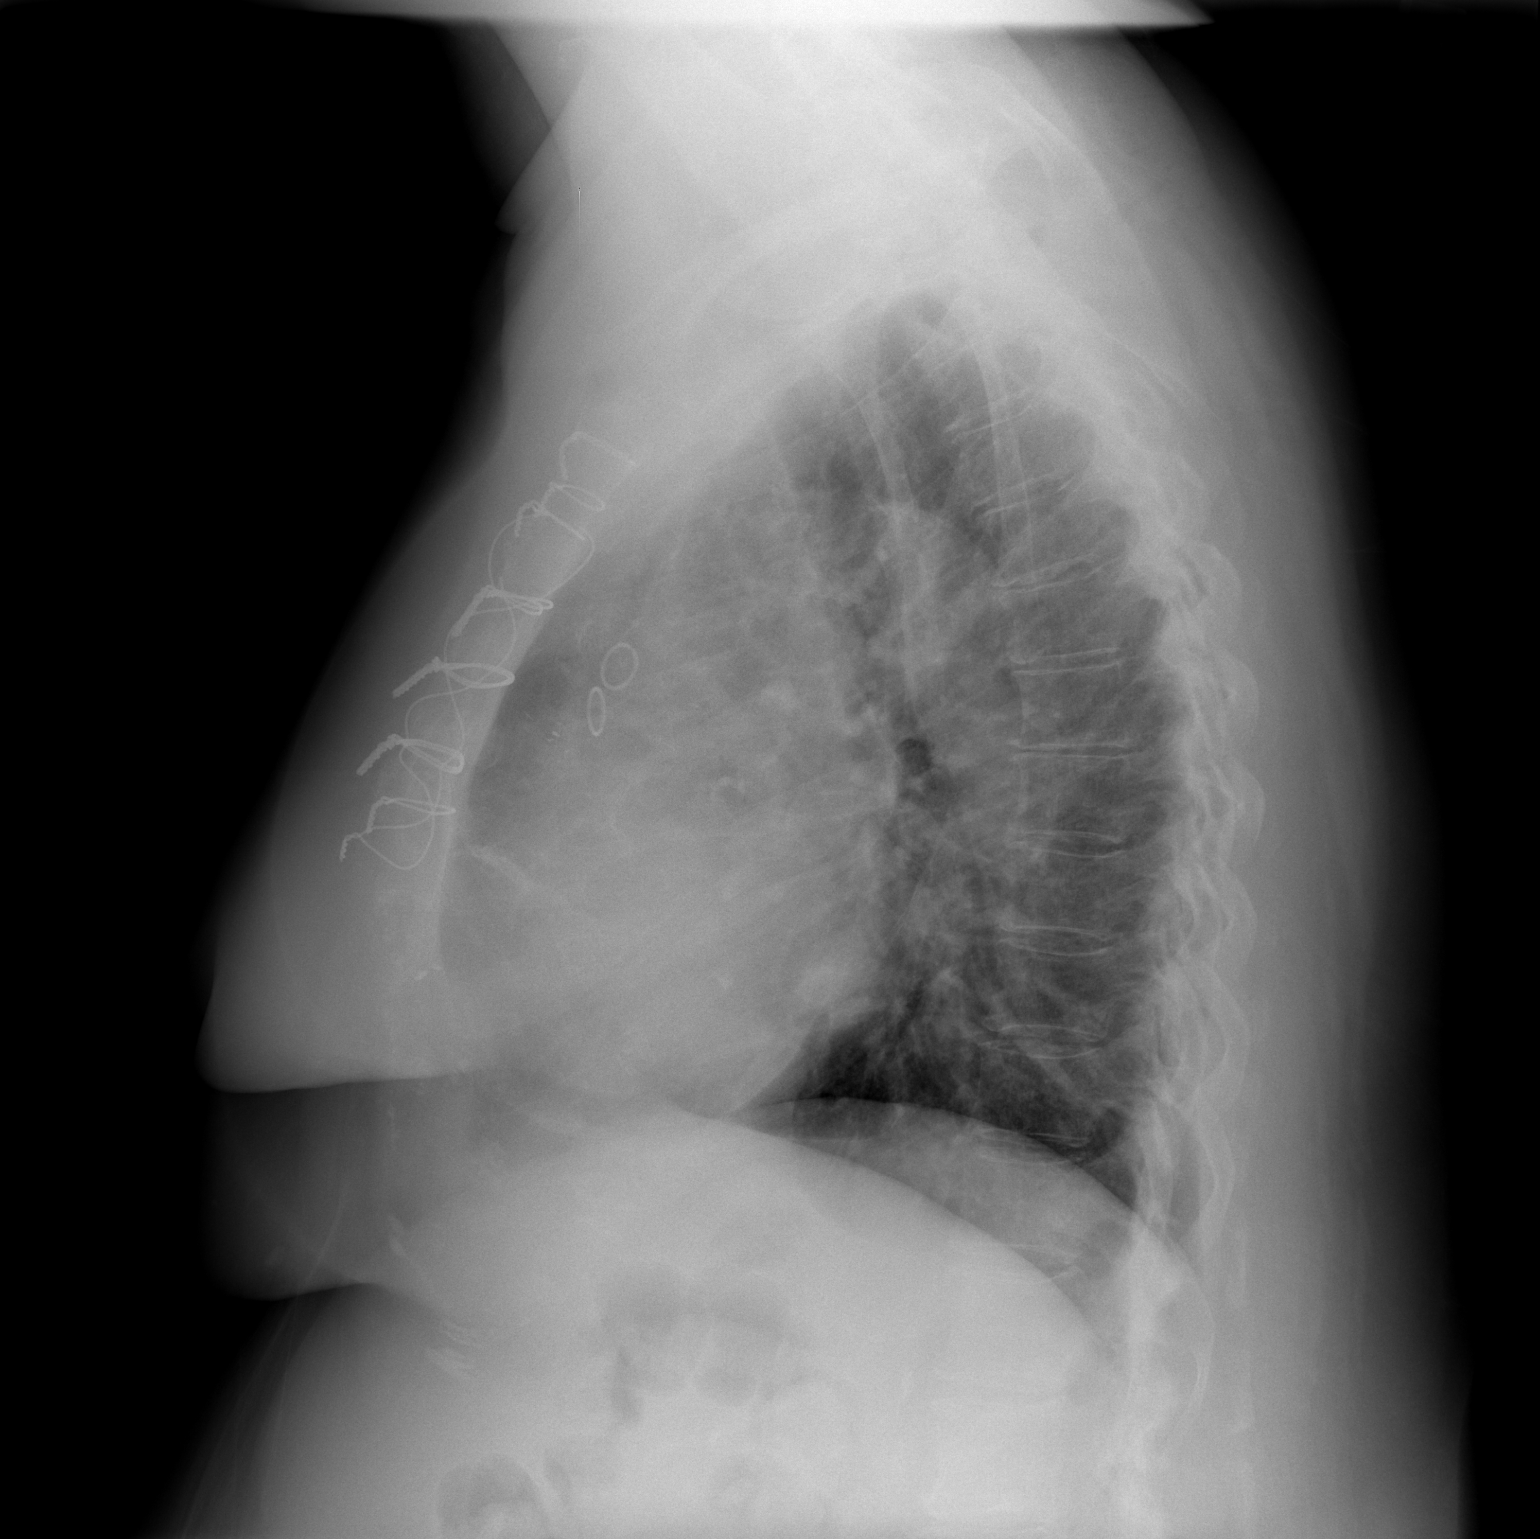

[2 of 2 positions shown; findings below may reference images not displayed]

FINDINGS: Vertical metallic sternal sutures show interval fracture in 2
places, without notable displacement. There is borderline
cardiomegaly which is stable from prior. Negative aortic and hilar
contours post CABG. Mild scarring is again noted in the left mid and
lower lung. There is no edema, consolidation, effusion, or
pneumothorax.
IMPRESSION: 1. Vertical sternal wire fracture which is new from 6660 and notable
due to the history.
2. Otherwise, stable chest.

## 2015-10-15 ENCOUNTER — Encounter: Payer: Medicare Other | Admitting: Cardiothoracic Surgery

## 2015-10-15 ENCOUNTER — Ambulatory Visit (INDEPENDENT_AMBULATORY_CARE_PROVIDER_SITE_OTHER): Payer: Medicare HMO | Admitting: Cardiothoracic Surgery

## 2015-10-15 ENCOUNTER — Encounter: Payer: Self-pay | Admitting: Cardiothoracic Surgery

## 2015-10-15 VITALS — BP 115/70 | HR 68 | Resp 20 | Ht 64.0 in | Wt 199.0 lb

## 2015-10-15 DIAGNOSIS — I5032 Chronic diastolic (congestive) heart failure: Secondary | ICD-10-CM

## 2015-10-15 DIAGNOSIS — Z951 Presence of aortocoronary bypass graft: Secondary | ICD-10-CM | POA: Diagnosis not present

## 2015-10-15 DIAGNOSIS — Z952 Presence of prosthetic heart valve: Secondary | ICD-10-CM | POA: Diagnosis not present

## 2015-10-15 DIAGNOSIS — I050 Rheumatic mitral stenosis: Secondary | ICD-10-CM | POA: Diagnosis not present

## 2015-10-15 NOTE — Progress Notes (Signed)
[address]       [place] [address]             [phone]                    Docia Furl [facility] Medical Record #[id number] Date of Birth: March 06, 1946  Referring: Ronny Bacon, MD Primary Care: No primary care provider on file.  Chief Complaint:    Chief Complaint  Patient presents with  . Mitral Regurgitation    3 week f/u  . Congestive Heart Failure    History of Present Illness:    [name] 70 y.o. female is seen in the office At the patient's request for second opinion concerning redo mitral valve surgery. In 2007 the patient had undergone urgent coronary artery bypass grafting and mitral valve replacement with a tissue valve and Maze procedure for long-standing critical mitral stenosis recently the patient had increasing symptoms of congestive heart failure and was hospitalized in [facility].  Echocardiogram was performed and she was transferred to [facility] patient was seen by the heart failure team cardiac catheterization was performed.  she was brought here by her family for an additional opinion about options and care. The patient is fairly limited both from a cardiac standpoint but also because of mobility issue with significant arthritis and gait instability.  Since seen several weeks ago the patient has improved, her heart failure which had been better controlled with strict salt intake and medications. She notes she is able to lay flat at night when several weeks ago she had 3 pillow orthopnea.   08/24/2005 OPERATIVE REPORT PREOPERATIVE DIAGNOSES: 1. Critical mitral stenosis. 2. Coronary occlusive disease. 3. Atrial fibrillation. POSTOPERATIVE DIAGNOSES: 1. Critical mitral stenosis. 2. Coronary occlusive disease. 3. Atrial fibrillation. SURGICAL PROCEDURE: 1. Coronary artery bypass grafting x4 with the left internal mammary to the  left anterior descending coronary artery, reversed  saphenous vein graft  to the diagonal coronary artery, sequential reversed saphenous vein  graft to the acute marginal and posterior descending coronary arteries. 2. Mitral valve replacement with a [employer], model [id number], serial  number [id number], mitral tissue valve prosthesis and left- and right-  side maze procedure with endo-vein harvesting. SURGEON: Sheliah Plane, M.D.   Current Activity/ Functional Status:  Patient is independent with mobility/ambulation, transfers, ADL's, IADL's., but very frail, trouble walking needs help and cane    Zubrod Score: At the time of surgery this patient's most appropriate activity status/level should be described as: []     0    Normal activity, no symptoms []     1    Restricted in physical strenuous activity but ambulatory, able to do out light work []     2    Ambulatory and capable of self care, unable to do work activities, up and about               >50 % of waking hours                              [x]     3    Only limited self care, in bed greater than 50% of waking hours []     4    Completely disabled, no self care, confined to bed or chair []     5    Moribund  Surgery Date Laterality Comments  BILAT TUBAL DIVISION NEC  HAND SKIN GRAFT NEC     HX MITRAL VALVE REPLACEMENT     REMV CATARACT EXTRACAP,INSERT LENS,COMP 06/19/2012 Left Procedure: EYE, CATARACT EXTRACTION, PHACO WITH IOL IMPLANT; Surgeon: Michaell Cowing, MD; Location: [facility] MAIN OR  HX TUBAL LIGATION     REMV CATARACT EXTRACAP,INSERT LENS,COMP 07/17/2012 Right Procedure: EYE, CATARACT EXTRACTION, PHACO WITH IOL IMPLANT; Surgeon: Michaell Cowing, MD; Location: [facility] MAIN OR  HX HEART CATHETERIZATION     HX CORONARY ARTERY BYPASS GRAFT   quadruple  HX CATARACT REMOVAL 2/14 Bilateral w/lens implant  HX TONSILLECTOMY     Medical History Medical History Date Comments  DM (diabetes mellitus) (HCC)    Hyperlipidemia LDL goal < 70     CAD (coronary artery disease) of bypass graft    Paroxysmal a-fib (HCC)    Diabetes (HCC)    High cholesterol    Coronary artery disease    Edema    Diverticulitis    Stiff joint    Arthritis    Dizziness    Vertigo    A-fib (HCC)    Pulmonary embolism (HCC)    Pneumonia    Joint pain    Unsteady gait    Anemia    Depression    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 H/O coronary artery bypass surgery 07/03/2014  . Mitral stenosis 09/05/2015    Overview:  Mod- Severe stenosis of MV prosthesis   . Chronic atrial fibrillation (HCC) 07/03/2014  . Diabetes mellitus, type 2 (HCC) 07/03/2014    No past surgical history on file.  No family history on file.  Social History   Social History  . Marital Status: Widowed    Spouse Name: N/A  . Number of Children: N/A  . Years of Education: N/A   Occupational History  . Not on file.   Social History Main Topics  . Smoking status: Former Smoker    Quit date: 08/25/2015  . Smokeless tobacco: Not on file  . Alcohol Use: 0.0 oz/week    0 Standard drinks or equivalent per week     Comment: wine  . Drug Use: No  . Sexual Activity: Not on file   Other Topics Concern  . Not on file   Social History Narrative    History  Smoking status  . Former Smoker  . Quit date: 08/25/2015  Smokeless tobacco  . Not on file    History  Alcohol Use  . 0.0 oz/week  . 0 Standard drinks or equivalent per week    Comment: wine     Allergies  Allergen Reactions  . Niacin And Related Hives  . Penicillins Hives     Current Outpatient Prescriptions  Medication Sig Dispense Refill  . apixaban (ELIQUIS) 5 MG TABS tablet Take 5 mg by mouth 2 (two) times daily.    Marland Kitchen aspirin EC 81 MG tablet Take by mouth.    Marland Kitchen atorvastatin (LIPITOR) 20 MG tablet Take 20 mg by mouth daily.    . Fish Oil OIL Take by mouth 2 (  two) times daily.     . insulin aspart (NOVOLOG) cartridge Inject into the skin 3 (three) times daily with meals. 12 units breakfast, 10 units, lunch, 8 units dinner    . insulin glargine (LANTUS) 100 UNIT/ML injection Inject 48 Units into the skin at bedtime.     . Insulin Pen Needle (FIFTY50 PEN NEEDLES) 31G X 5 MM MISC by Does not apply route.    Marland Kitchen. levothyroxine (SYNTHROID, LEVOTHROID) 175 MCG tablet Take by mouth.    Marland Kitchen. lisinopril-hydrochlorothiazide (PRINZIDE,ZESTORETIC) 10-12.5 MG per tablet Take 1 tablet by mouth daily.    Marland Kitchen. loperamide (IMODIUM) 2 MG capsule Take by mouth.    . metoprolol (LOPRESSOR) 50 MG tablet Take 50 mg by mouth 2 (two) times daily.    . Omega-3 Fatty Acids ([employer] FISH OIL) 1200 MG CAPS Take by mouth.    Marland Kitchen. omeprazole (PRILOSEC) 20 MG capsule Take by mouth.    . sertraline (ZOLOFT) 50 MG tablet Take 50 mg by mouth daily.     Marland Kitchen. spironolactone (ALDACTONE) 25 MG tablet Take by mouth.    . bumetanide (BUMEX) 2 MG tablet Take by mouth.     No current facility-administered medications for this visit.      Review of Systems:     Cardiac Review of Systems: Y or N  Chest Pain [ N   ]  Resting SOB [  Y ] Exertional SOB  [Y  ]  Orthopnea Gilian.Kraft[Y  ]   Pedal Edema [ Y  ]    Palpitations [Y ] Syncope  Klaus.Mock[N  ]   Presyncope Gilian.Kraft[Y   ]  General Review of Systems: [Y] = yes [  ]=no Constitional: recent weight change [  ];  Wt loss over the last 3 months [   ] anorexia [  ]; fatigue [Y  ]; nausea [  ]; night sweats [  ]; fever [  ]; or chills [  ];          Dental: poor dentition[  ]; Last Dentist visit:   Eye : blurred vision [  ]; diplopia [   ]; vision changes [  ];  Amaurosis fugax[  ]; Resp:  cough [  ];  wheezing[  ];  hemoptysis[  ]; shortness of breath[  ]; paroxysmal nocturnal dyspnea[  ]; dyspnea on exertion[  ]; or orthopnea[  ];  GI:  gallstones[  ], vomiting[  ];  dysphagia[  ]; melena[  ];  hematochezia [  ]; heartburn[  ];   Hx of  Colonoscopy[  ]; GU: kidney stones [  ]; hematuria[  ];   dysuria [  ];  nocturia[  ];  history of     obstruction [  ]; urinary frequency [  ]             Skin: rash, swelling[  ];, hair loss[  ];  peripheral edema[  ];  or itching[  ]; Musculosketetal: myalgias[Y  ];  joint swelling[  ];  joint erythema[  ];  joint pain[Y  ];  back pain[Y  ];  Heme/Lymph: bruising[ Y ];  bleeding[  ];  anemia[  ];  Neuro: TIA[  ];  headaches[  ];  stroke[  ];  vertigo[  ];  seizures[N  ];   paresthesias[  ];  difficulty walking[ Y ];  Psych:depression[  ]; anxiety[  ];  Endocrine: diabetes[ Y ];  thyroid dysfunction[Y  ];  Immunizations: Flu up to date [  ];  Pneumococcal up to date [  ];  Other:  Physical Exam: BP 115/70 mmHg  Pulse 68  Resp 20  Ht 5\' 4"  (1.626 m)  Wt 199 lb (90.266 kg)  BMI 34.14 kg/m2  SpO2 97%  PHYSICAL EXAMINATION: General appearance: alert, cooperative, appears older than stated age and fatigued Head: Normocephalic, without obvious abnormality, atraumatic Neck: no adenopathy, no carotid bruit, no JVD, supple, symmetrical, trachea midline and thyroid not enlarged, symmetric, no tenderness/mass/nodules Lymph nodes: Cervical, supraclavicular, and axillary nodes normal. Resp: diminished breath sounds bibasilar Back: symmetric, no curvature. ROM normal. No CVA tenderness. Cardio: irregularly irregular rhythm GI: soft, non-tender; bowel sounds normal; no masses,  no organomegaly Extremities: varicose veins noted left leg, removed from right leg and venous stasis dermatitis noted Neurologic: Grossly normal Pedal edema is much improved from several weeks ago  Diagnostic Studies & Laboratory data:     Recent Radiology Findings:    No results found.   I have independently reviewed the above radiologic studies.  Recent Lab Findings: Lab Results  Component Value Date   WBC 7.4 01/24/2008   HGB 12.8 01/24/2008   HCT 36.8 01/24/2008   PLT 201 01/24/2008   GLUCOSE 227* 01/24/2008   CHOL * 01/19/2008    633        ATP III CLASSIFICATION:  <200     mg/dL   Desirable  562-130200-239  mg/dL   Borderline High  >=865>=240    mg/dL   High   TRIG [id number] RESULTS CONFIRMED BY MANUAL DILUTION* 01/19/2008   HDL NOT REPORTED DUE TO HIGH TRIGLYCERIDES 01/19/2008   LDLCALC UNABLE TO CALCULATE IF TRIGLYCERIDE OVER 400 mg/dL 96/29/528409/26/2009   ALT 132128* 44/01/027210/04/2007   AST 97* 01/24/2008   NA 135 01/24/2008   K 4.9 SLIGHT HEMOLYSIS LIPEMIC SPECIMEN 01/24/2008   CL 101 01/24/2008   CREATININE 0.65 01/24/2008   BUN 11 01/24/2008   CO2 28 01/24/2008   TSH * 01/20/2008    38.646 (NOTE) Specimen ultracentrifuged due to lipemia. Test methodology is 3rd generation TSH   INR 2.4* 01/24/2008   HGBA1C * 01/19/2008    10.8 (NOTE)   The ADA recommends the following therapeutic goal for glycemic   control related to Hgb A1C measurement:   Goal of Therapy:   < 7.0% Hgb A1C   Reference: American Diabetes Association: Clinical Practice   Recommendations 2008, Diabetes Care,  2008, 31:(Suppl 1).   ECHO: TEE [facility]  -loaded into [facility] system Procedure:          Transesophageal Echo with color Doppler Indication(s):      Known Valve Disease Referring Provider: B. Brent BullaBurkholder MD Sonographer:        Larene Pickettebra Flint MS,DCS   Summary:  1. Overall left ventricular ejection fraction is estimated at 60 to 65%.  2. Mildly reduced RV systolic function.  3. Moderately increased RV wall thickness.  4. Mildly dilated left atrium.  5. Severely dilated right atrium.  6. The anterior leaflet of the mitral prosthesis appears fixed and immobile. the posetior cusp is restricted but has some opening The mean gradient was 9 mmHG with a peak of 18 mmHG suggesting severe stenosis of  the prosthesis     There is a small jet of MR through then degererated valve.  7. Moderate tricuspid regurgitation.  PROCEDURE: After discussion of the risks and benefits of the TEE, an informed consent was obtained. Anesthesia services provided by staff from the department of anesthesia. The TEE probe was passed without difficulty  by the Cardiologist. The patient's vital signs; including heart rate, blood pressure, and oxygen saturation; remained stable throughout the procedure. The patient tolerated the procedure well and without complications.  Left Ventricle: Overall left ventricular ejection fraction is estimated at 60 to 65%.  Right Ventricle: The right ventricular size is normal. RV wall thickness is moderately increased. Global RV systolic function is mildly reduced.  Left Atrium: The left atrium is mildly dilated. The left atrial appendage is well visualized and there is no evidence of thrombus present. LAA appears ligated.  Right Atrium: The right atrium is severely dilated.  Pericardium: There is no evidence of pericardial effusion.  Mitral Valve: Trace mitral valve regurgitation is seen. The anterior leaflet of the mitral prosthesis appears fixed and immobile. the posetior cusp is restricted but has some opening The mean gradient was 9 mmHG with a peak of 18 mmHG suggesting severe stenosis of the prosthesis There is a small jet of MR through then degererated valve.  Tricuspid Valve: The tricuspid valve is normal in structure. Moderate tricuspid regurgitation is visualized.  Aortic Valve: The aortic valve appears trileaflet. There is mild aortic valve sclerosis. Trace aortic valve regurgitation is seen.  Pulmonic Valve: The pulmonic valve was not well visualized.  Aorta: The aortic root appears normal in size and structure.  Shunts: There is no evidence of a patent foramen ovale. There is no evidence of an atrial septal defect. No ventricular septal defect is  seen.   Mitral Valve: MV Max Vel:   1.60 m/s MV Mean Grad: 6.0 mmHg Electronically signed by Marene Lenz, MD Signature Date/Time: 09/04/2015/2:12:17 PM  CATH: [facility] Requesting disk Result Narrative  1. Patent LIMA graft to LAD. 2. Patent SVG to Diagonal branch. 3. Patent SVG to Right coronary artery.  4. Normal left ventricular end diastolic pressures.  5. Moderate to severe mitral stenosis, mean gradient 8 mmHg, mitral valve  area 1.38 cm2. 6. Moderate to severe pulmonary hypertension. Peak PA pressure 53 mmHg.    Assessment / Plan:   Stage III congestive heart failure, with evidence of moderate to severe mitral stenosis secondary to prosthetic valve calcification with evidence of severe pulmonary hypertension, Over that although patient is extremely frail a redo operation carry significant morbidity and mortality regardless if it was by sternotomy or right thoracotomy. With aggressive medical therapy the patient has had symptomatic relief over the past several weeks with her congestive heart failure but with the fixed severe mitral stenosis of her previously placed tissue valve likely this will only be temporary . With the recent approval of mitral  valve in  valve Sapian  for mitral valve disease I reviewed the case with Dr. Excell Seltzer who is willing to see the patient to  discuss possible mitral valve in valve  procedure.    Delight Ovens MD      [address] [place].[address] [place] [address] Office [phone]   Beeper [phone]  10/15/2015 1:53 PM

## 2015-10-26 ENCOUNTER — Institutional Professional Consult (permissible substitution): Payer: Medicare HMO | Admitting: Cardiovascular Disease

## 2015-11-17 ENCOUNTER — Telehealth: Payer: Self-pay | Admitting: *Deleted

## 2015-11-17 NOTE — Telephone Encounter (Signed)
Spoke with patients daughter this morning and she stated that her plan is to have Transcatheter MVR surgery on August 8th, 2017 in [place], [place].  All questions answered and she will call me back If she needs anything.

## 2019-07-25 DEATH — deceased
